# Patient Record
Sex: Female | Born: 1990 | Race: White | Hispanic: No | Marital: Married | Smoking: Never smoker
Health system: Southern US, Community
[De-identification: ages and names within clinical notes are randomized; demographics above are authoritative.]

## PROBLEM LIST (undated history)

## (undated) ENCOUNTER — Inpatient Hospital Stay (HOSPITAL_COMMUNITY): Payer: Self-pay

## (undated) DIAGNOSIS — Z8759 Personal history of other complications of pregnancy, childbirth and the puerperium: Secondary | ICD-10-CM

## (undated) DIAGNOSIS — D649 Anemia, unspecified: Secondary | ICD-10-CM

## (undated) HISTORY — PX: CHOLECYSTECTOMY: SHX55

---

## 2013-06-25 ENCOUNTER — Inpatient Hospital Stay (HOSPITAL_COMMUNITY)
Admission: AD | Admit: 2013-06-25 | Discharge: 2013-06-25 | Disposition: A | Discharge status: Home / Self Care | Payer: Medicaid Other | Source: Ambulatory Visit | Attending: Obstetrics and Gynecology | Admitting: Obstetrics and Gynecology

## 2013-06-25 ENCOUNTER — Encounter (HOSPITAL_COMMUNITY): Payer: Self-pay | Admitting: *Deleted

## 2013-06-25 DIAGNOSIS — O200 Threatened abortion: Secondary | ICD-10-CM | POA: Diagnosis present

## 2013-06-25 NOTE — MAU Note (Signed)
I should be 13wks by LMP but Thurs my u/s showed 7wks. Told me could be a miscarriage -no heart beat seen and sac not round. Was not given much detail of what to expect. Today having some cramping and brown/pink spotting

## 2013-06-25 NOTE — MAU Provider Note (Signed)
  History     CSN: [id number]  Arrival date and time: 06/25/13 1950   None     Chief Complaint  Patient presents with  . Abdominal Cramping  . Vaginal Bleeding   HPI This is a 23 y.o. female at 6932w4d by LMP who presents with vaginal bleeding. States not much on pad today but more like period bleeding now. Some mild cramping, tolerable without meds. Was seen in office with US that showed probable abnormal pregnancy. Has appt Monday for followup US.  RN Note:  I should be 13wks by LMP but Thurs my u/s showed 7wks. Told me could be a miscarriage -no heart beat seen and sac not round. Was not given much detail of what to expect. Today having some cramping and brown/pink spotting       OB History   Grav Para Term Preterm Abortions TAB SAB Ect Mult Living   1               History reviewed. No pertinent past medical history.  History reviewed. No pertinent past surgical history.  History reviewed. No pertinent family history.  History  Substance Use Topics  . Smoking status: Never Smoker   . Smokeless tobacco: Not on file  . Alcohol Use: No    Allergies: No Known Allergies  No prescriptions prior to admission    Review of Systems  Constitutional: Negative for fever, chills and malaise/fatigue.  Gastrointestinal: Positive for abdominal pain (mild cramps). Negative for nausea and vomiting.  Genitourinary:       Light period like bleeding  Neurological: Negative for dizziness and headaches.   Physical Exam   Blood pressure 124/73, pulse 95, temperature 98.4 F (36.9 C), resp. rate 20, height 5\' 4"  (1.626 m), weight 104.872 kg (231 lb 3.2 oz), last menstrual period 03/22/2013.  Physical Exam  Constitutional: She is oriented to person, place, and time. She appears well-developed and well-nourished. No distress.  HENT:  Head: Normocephalic.  Cardiovascular: Normal rate.   Respiratory: Effort normal.  GI: Soft. She exhibits no distension and no mass. There is no  tenderness. There is no rebound and no guarding.  Genitourinary: Uterus normal. Vaginal discharge (moderate blood in vault, no active flow, cervix long/closed, uterus nontender) found.  Musculoskeletal: Normal range of motion. She exhibits no edema.  Neurological: She is alert and oriented to person, place, and time.  Skin: Skin is warm and dry.  Psychiatric: She has a normal mood and affect.    MAU Course  Procedures  MDM DIscussed with Dr Henderson CloudHorvath. Offered Cytotec and bleeding precautions just in case she needs D&C.  Patient prefers to wait expectantly for Monday. States she knows it it probably miscarriage, but wants to hold out hope a bit longer.   Assessment and Plan  A:  SIUP at 4332w4d by dates, 7 wks by US      Threatened abortion  P:  Discussed issues with patient        Expectant management preferred for now        Bleeding precautions reviewed.         Followup in office as scheduled or PRN here.  [facility] CenterWILLIAMS,Rasheka Denard 06/25/2013, 8:45 PM

## 2013-06-25 NOTE — Discharge Instructions (Signed)
Threatened Miscarriage °Bleeding during the first 20 weeks of pregnancy is common. This is sometimes called a threatened miscarriage. This is a pregnancy that is threatening to end before the twentieth week of pregnancy. Often this bleeding stops with bed rest or decreased activities as suggested by your caregiver and the pregnancy continues without any more problems. You may be asked to not have sexual intercourse, have orgasms or use tampons until further notice. Sometimes a threatened miscarriage can progress to a complete or incomplete miscarriage. This may or may not require further treatment. Some miscarriages occur before a woman misses a menstrual period and knows she is pregnant. °Miscarriages occur in 15 to 20% of all pregnancies and usually occur during the first 13 weeks of the pregnancy. The exact cause of a miscarriage is usually never known. A miscarriage is natures way of ending a pregnancy that is abnormal or would not make it to term. There are some things that may put you at risk to have a miscarriage, such as: °· Hormone problems. °· Infection of the uterus or cervix. °· Chronic illness, diabetes for example, especially if it is not controlled. °· Abnormal shaped uterus. °· Fibroids in the uterus. °· Incompetent cervix (the cervix is too weak to hold the baby). °· Smoking. °· Drinking too much alcohol. It's best not to drink any alcohol when you are pregnant. °· Taking illegal drugs. °TREATMENT  °When a miscarriage becomes complete and all products of conception (all the tissue in the uterus) have been passed, often no treatment is needed. If you think you passed tissue, save it in a container and take it to your doctor for evaluation. If the miscarriage is incomplete (parts of the fetus or placenta remain in the uterus), further treatment may be needed. The most common reason for further treatment is continued bleeding (hemorrhage) because pregnancy tissue did not pass out of the uterus. This  often occurs if a miscarriage is incomplete. Tissue left behind may also become infected. Treatment usually is dilatation and curettage (the removal of the remaining products of pregnancy. This can be done by a simple sucking procedure (suction curettage) or a simple scraping of the inside of the uterus. This may be done in the hospital or in the caregiver's office. This is only done when your caregiver knows that there is no chance for the pregnancy to proceed to term. This is determined by physical examination, negative pregnancy test, falling pregnancy hormone count and/or, an ultrasound revealing a dead fetus. °Miscarriages are often a very emotional time for prospective mothers and fathers. This is not you or your partners fault. It did not occur because of an inadequacy in you or your partner. Nearly all miscarriages occur because the pregnancy has started off wrongly. At least half of these pregnancies have a chromosomal abnormality. It is almost always not inherited. Others may have developmental problems with the fetus or placenta. This does not always show up even when the products miscarried are studied under the microscope. The miscarriage is nearly always not your fault and it is not likely that you could have prevented it from happening. If you are having emotional and grieving problems, talk to your health care provider and even seek counseling, if necessary, before getting pregnant again. You can begin trying for another pregnancy as soon as your caregiver says it is OK. °HOME CARE INSTRUCTIONS  °· Your caregiver may order bed rest depending on how much bleeding and cramping you are having. You may be limited   to only getting up to go to the bathroom. You may be allowed to continue light activity. You may need to make arrangements for the care of your other children and for any other responsibilities. °· Keep track of the number of pads you use each day, how often you have to change pads and how  saturated (soaked) they are. Record this information. °· DO NOT USE TAMPONS. Do not douche, have sexual intercourse or orgasms until approved by your caregiver. °· You may receive a follow up appointment for re-evaluation of your pregnancy and a repeat blood test. Re-evaluation often occurs after 2 days and again in 4 to 6 weeks. It is very important that you follow-up in the recommended time period. °· If you are Rh negative and the father is Rh positive or you do not know the fathers' blood type, you may receive a shot (Rh immune globulin) to help prevent abnormal antibodies that can develop and affect the baby in any future pregnancies. °SEEK IMMEDIATE MEDICAL CARE IF: °· You have severe cramps in your stomach, back, or abdomen. °· You have a sudden onset of severe pain in the lower part of your abdomen. °· You develop chills. °· You run an unexplained temperature of 101° F (38.3° C) or higher. °· You pass large clots or tissue. Save any tissue for your caregiver to inspect. °· Your bleeding increases or you become light-headed, weak, or have fainting episodes. °· You have a gush of fluid from your vagina. °· You pass out. This could mean you have a tubal (ectopic) pregnancy. °Document Released: 04/28/2005 Document Revised: 07/21/2011 Document Reviewed: 12/13/2007 °ExitCare® Patient Information ©2014 [employer]. ° ° °Pelvic Rest °Pelvic rest is sometimes recommended for women when:  °· The placenta is partially or completely covering the opening of the cervix (placenta previa). °· There is bleeding between the uterine wall and the amniotic sac in the first trimester (subchorionic hemorrhage). °· The cervix begins to open without labor starting (incompetent cervix, cervical insufficiency). °· The labor is too early (preterm labor). °HOME CARE INSTRUCTIONS °· Do not have sexual intercourse, stimulation, or an orgasm. °· Do not use tampons, douche, or put anything in the vagina. °· Do not lift anything over 10  pounds (4.5 kg). °· Avoid strenuous activity or straining your pelvic muscles. °SEEK MEDICAL CARE IF:  °· You have any vaginal bleeding during pregnancy. Treat this as a potential emergency. °· You have cramping pain felt low in the stomach (stronger than menstrual cramps). °· You notice vaginal discharge (watery, mucus, or bloody). °· You have a low, dull backache. °· There are regular contractions or uterine tightening. °SEEK IMMEDIATE MEDICAL CARE IF: °You have vaginal bleeding and have placenta previa.  °Document Released: 08/23/2010 Document Revised: 07/21/2011 Document Reviewed: 08/23/2010 °ExitCare® Patient Information ©2014 [employer]. ° °

## 2013-11-02 LAB — OB RESULTS CONSOLE ABO/RH: RH TYPE: POSITIVE

## 2013-11-02 LAB — OB RESULTS CONSOLE RUBELLA ANTIBODY, IGM: Rubella: IMMUNE

## 2013-11-02 LAB — OB RESULTS CONSOLE RPR: RPR: NONREACTIVE

## 2013-11-02 LAB — OB RESULTS CONSOLE HEPATITIS B SURFACE ANTIGEN: HEP B S AG: NEGATIVE

## 2013-11-02 LAB — OB RESULTS CONSOLE ANTIBODY SCREEN: Antibody Screen: NEGATIVE

## 2013-11-02 LAB — OB RESULTS CONSOLE HIV ANTIBODY (ROUTINE TESTING): HIV: NONREACTIVE

## 2014-03-13 ENCOUNTER — Encounter (HOSPITAL_COMMUNITY): Payer: Self-pay | Admitting: *Deleted

## 2014-03-20 LAB — OB RESULTS CONSOLE GC/CHLAMYDIA
Chlamydia: NEGATIVE
Gonorrhea: NEGATIVE

## 2014-03-22 ENCOUNTER — Inpatient Hospital Stay (HOSPITAL_COMMUNITY)
Admission: AD | Admit: 2014-03-22 | Discharge: 2014-03-22 | Disposition: A | Discharge status: Home / Self Care | Payer: Medicaid Other | Source: Ambulatory Visit | Attending: Obstetrics and Gynecology | Admitting: Obstetrics and Gynecology

## 2014-03-22 ENCOUNTER — Encounter (HOSPITAL_COMMUNITY): Payer: Self-pay

## 2014-03-22 DIAGNOSIS — O2343 Unspecified infection of urinary tract in pregnancy, third trimester: Secondary | ICD-10-CM | POA: Insufficient documentation

## 2014-03-22 DIAGNOSIS — R109 Unspecified abdominal pain: Secondary | ICD-10-CM | POA: Diagnosis present

## 2014-03-22 DIAGNOSIS — O99891 Other specified diseases and conditions complicating pregnancy: Secondary | ICD-10-CM

## 2014-03-22 DIAGNOSIS — O9989 Other specified diseases and conditions complicating pregnancy, childbirth and the puerperium: Secondary | ICD-10-CM

## 2014-03-22 DIAGNOSIS — N390 Urinary tract infection, site not specified: Secondary | ICD-10-CM

## 2014-03-22 DIAGNOSIS — Z3A31 31 weeks gestation of pregnancy: Secondary | ICD-10-CM | POA: Diagnosis not present

## 2014-03-22 DIAGNOSIS — M549 Dorsalgia, unspecified: Secondary | ICD-10-CM

## 2014-03-22 LAB — FETAL FIBRONECTIN: Fetal Fibronectin: NEGATIVE

## 2014-03-22 LAB — URINE MICROSCOPIC-ADD ON

## 2014-03-22 LAB — URINALYSIS, ROUTINE W REFLEX MICROSCOPIC
Bilirubin Urine: NEGATIVE
Glucose, UA: NEGATIVE mg/dL
Hgb urine dipstick: NEGATIVE
Ketones, ur: NEGATIVE mg/dL
NITRITE: NEGATIVE
Protein, ur: NEGATIVE mg/dL
Specific Gravity, Urine: 1.02 (ref 1.005–1.030)
Urobilinogen, UA: 1 mg/dL (ref 0.0–1.0)
pH: 7 (ref 5.0–8.0)

## 2014-03-22 LAB — WET PREP, GENITAL
Clue Cells Wet Prep HPF POC: NONE SEEN
TRICH WET PREP: NONE SEEN
Yeast Wet Prep HPF POC: NONE SEEN

## 2014-03-22 MED ORDER — BETAMETHASONE SOD PHOS & ACET 6 (3-3) MG/ML IJ SUSP
12.0000 mg | Freq: Once | INTRAMUSCULAR | Status: AC
  Administered 2014-03-22: 12 mg via INTRAMUSCULAR
  Filled 2014-03-22: qty 2

## 2014-03-22 MED ORDER — NITROFURANTOIN MONOHYD MACRO 100 MG PO CAPS: 100.0000 mg | ORAL_CAPSULE | Freq: Two times a day (BID) | ORAL | Status: DC

## 2014-03-22 NOTE — Discharge Instructions (Signed)
Asymptomatic Bacteriuria Asymptomatic bacteriuria is the presence of a large number of bacteria in your urine without the usual symptoms of burning or frequent urination. The following conditions increase the risk of asymptomatic bacteriuria:  Diabetes mellitus.  Advanced age.  Pregnancy in the first trimester.  Kidney stones.  Kidney transplants.  Leaky kidney tube valve in young children (reflux). Treatment for this condition is not needed in most people and can lead to other problems such as too much yeast and growth of resistant bacteria. However, some people, such as pregnant women, do need treatment to prevent kidney infection. Asymptomatic bacteriuria in pregnancy is also associated with fetal growth restriction, premature labor, and newborn death. HOME CARE INSTRUCTIONS Monitor your condition for any changes. The following actions may help to relieve any discomfort you are feeling:  Drink enough water and fluids to keep your urine clear or pale yellow. Go to the bathroom more often to keep your bladder empty.  Keep the area around your vagina and rectum clean. Wipe yourself from front to back after urinating. SEEK IMMEDIATE MEDICAL CARE IF:  You develop signs of an infection such as:  Burning with urination.  Frequency of voiding.  Back pain.  Fever.  You have blood in the urine.  You develop a fever. MAKE SURE YOU:  Understand these instructions.  Will watch your condition.  Will get help right away if you are not doing well or get worse. Document Released: 04/28/2005 Document Revised: 09/12/2013 Document Reviewed: 10/18/2012 ExitCare Patient Information 2015 [employer]. This information is not intended to replace advice given to you by your health care provider. Make sure you discuss any questions you have with your health care provider.  

## 2014-03-22 NOTE — MAU Note (Signed)
Patient states she has been having pain all over the abdomen since about 1430. Has nausea, no vomiting. Denies leaking or bleeding but has a vaginal discharge. Reports good fetal movement but feeling movement lower in the abdomen.

## 2014-03-22 NOTE — MAU Provider Note (Signed)
History     CSN: [id number]  Arrival date and time: 03/22/14 1702   First Provider Initiated Contact with Patient 03/22/14 1752      Chief Complaint  Patient presents with  . Abdominal Pain   HPI   [name] is a 23 y.o. female G2P0010 at 419w6d who presents with contraction like pain and pain when she walks. At times the pain is in her abdomen and other times it is in her back. The pain started today around 1500 and has stayed about the same in intensity; the pain comes and goes. + fetal movements, denies leaking of fluid or vaginal bleeding.   No intercourse in the last 24 hour.   OB History    Gravida Para Term Preterm AB TAB SAB Ectopic Multiple Living   2    1  1          History reviewed. No pertinent past medical history.  History reviewed. No pertinent past surgical history.  Family History  Problem Relation Age of Onset  . Heart disease Mother   . Diabetes Father     History  Substance Use Topics  . Smoking status: Never Smoker   . Smokeless tobacco: Not on file  . Alcohol Use: No    Allergies: No Known Allergies  Prescriptions prior to admission  Medication Sig Dispense Refill Last Dose  . ferrous sulfate 325 (65 FE) MG tablet Take 325 mg by mouth daily with breakfast.   03/21/2014 at 2200  . fluconazole (DIFLUCAN) 150 MG tablet Take 150 mg by mouth once.   03/21/2014 at Unknown time  . Prenatal Vit-Fe Fumarate-FA (PRENATAL MULTIVITAMIN) TABS tablet Take 1 tablet by mouth daily at 12 noon.   Past Week at 2200  . ranitidine (ZANTAC) 150 MG capsule Take 150 mg by mouth daily as needed for heartburn.   03/21/2014 at 1200  . acetaminophen (TYLENOL) 325 MG tablet Take 650 mg by mouth every 6 (six) hours as needed for headache.   Completed Course at Unknown time   Results for orders placed or performed during the hospital encounter of 03/22/14 (from the past 48 hour(s))  Urinalysis, Routine w reflex microscopic     Status: Abnormal   Collection Time:  03/22/14  5:20 PM  Result Value Ref Range   Color, Urine YELLOW YELLOW   APPearance CLEAR CLEAR   Specific Gravity, Urine 1.020 1.005 - 1.030   pH 7.0 5.0 - 8.0   Glucose, UA NEGATIVE NEGATIVE mg/dL   Hgb urine dipstick NEGATIVE NEGATIVE   Bilirubin Urine NEGATIVE NEGATIVE   Ketones, ur NEGATIVE NEGATIVE mg/dL   Protein, ur NEGATIVE NEGATIVE mg/dL   Urobilinogen, UA 1.0 0.0 - 1.0 mg/dL   Nitrite NEGATIVE NEGATIVE   Leukocytes, UA SMALL (A) NEGATIVE  Urine microscopic-add on     Status: Abnormal   Collection Time: 03/22/14  5:20 PM  Result Value Ref Range   Squamous Epithelial / LPF FEW (A) RARE   WBC, UA 3-6 <3 WBC/hpf   RBC / HPF 0-2 <3 RBC/hpf   Bacteria, UA MANY (A) RARE   Urine-Other MUCOUS PRESENT   Fetal fibronectin     Status: None   Collection Time: 03/22/14  6:10 PM  Result Value Ref Range   Fetal Fibronectin NEGATIVE NEGATIVE  Wet prep, genital     Status: Abnormal   Collection Time: 03/22/14  6:10 PM  Result Value Ref Range   Yeast Wet Prep HPF POC NONE SEEN NONE SEEN  Trich, Wet Prep NONE SEEN NONE SEEN   Clue Cells Wet Prep HPF POC NONE SEEN NONE SEEN   WBC, Wet Prep HPF POC FEW (A) NONE SEEN    Comment: MODERATE BACTERIA SEEN    Review of Systems  Constitutional: Negative for fever and chills.  Gastrointestinal: Positive for nausea and abdominal pain. Negative for vomiting, diarrhea and constipation.  Genitourinary: Negative for dysuria, urgency and frequency.  Musculoskeletal: Positive for back pain (lower back pain ).   Physical Exam   Blood pressure 123/76, pulse 114, temperature 98.6 F (37 C), temperature source Oral, resp. rate 20, height 5\' 4"  (1.626 m), weight 111.313 kg (245 lb 6.4 oz), SpO2 99 %, unknown if currently breastfeeding.  Physical Exam  Constitutional: She is oriented to person, place, and time. She appears well-developed and well-nourished. No distress.  Cardiovascular: Normal rate and normal heart sounds.   Respiratory: Effort  normal.  GI: Soft. There is no tenderness. There is no CVA tenderness.  Genitourinary:  Speculum exam: Vagina - Small amount of creamy discharge in vaginal canal, no odor Cervix - No contact bleeding, moderate amount of mucoid discharge at cervical os  Bimanual exam: Cervix 1 cm, thick, posterior   wet prep done, fetal fibronectin  Chaperone present for exam.   Musculoskeletal: Normal range of motion.  Neurological: She is alert and oriented to person, place, and time.  Skin: Skin is warm. She is not diaphoretic.  Psychiatric: Her speech is normal and behavior is normal. Her mood appears anxious.    Fetal Tracing: Baseline: 125 bpm  Variability: Moderate  Accelerations: 15x15 Decelerations: none Toco: Q2-4 with irregular pattern, occasional UI .  Palpated uterine tone: relaxed   MAU Course  Procedures  None  MDM Fetal fibronectin negative UA shows many bacteria  Urine culture pending  Discussed fetal tracing, vital signs,cervical exam, and labs with Dr. Claiborne Billingsallahan .  Betamethasone today    Assessment and Plan   A: Preterm labor UTI   P: Discharge home in stable condition RX: Macrobid  Repeat betamethasone in 24 hours; patient to come to MAU Urine culture pending  Preterm labor precautions Return to MAU if symptoms worsen Pelvic rest   Iona HansenJennifer Irene Rasch, NP 03/22/2014 7:22 PM

## 2014-03-23 ENCOUNTER — Encounter (HOSPITAL_COMMUNITY): Payer: Self-pay | Admitting: *Deleted

## 2014-03-23 ENCOUNTER — Inpatient Hospital Stay (HOSPITAL_COMMUNITY)
Admission: AD | Admit: 2014-03-23 | Discharge: 2014-03-24 | Disposition: A | Discharge status: Home / Self Care | Payer: Medicaid Other | Source: Ambulatory Visit | Attending: Obstetrics and Gynecology | Admitting: Obstetrics and Gynecology

## 2014-03-23 DIAGNOSIS — R109 Unspecified abdominal pain: Secondary | ICD-10-CM | POA: Diagnosis not present

## 2014-03-23 DIAGNOSIS — Z3A32 32 weeks gestation of pregnancy: Secondary | ICD-10-CM | POA: Diagnosis not present

## 2014-03-23 DIAGNOSIS — O26899 Other specified pregnancy related conditions, unspecified trimester: Secondary | ICD-10-CM

## 2014-03-23 DIAGNOSIS — O26893 Other specified pregnancy related conditions, third trimester: Secondary | ICD-10-CM | POA: Insufficient documentation

## 2014-03-23 DIAGNOSIS — O9989 Other specified diseases and conditions complicating pregnancy, childbirth and the puerperium: Secondary | ICD-10-CM

## 2014-03-23 LAB — URINE CULTURE: SPECIAL REQUESTS: NORMAL

## 2014-03-23 LAB — URINALYSIS, ROUTINE W REFLEX MICROSCOPIC
GLUCOSE, UA: NEGATIVE mg/dL
HGB URINE DIPSTICK: NEGATIVE
Ketones, ur: >80 mg/dL — AB
Nitrite: NEGATIVE
PH: 6.5 (ref 5.0–8.0)
PROTEIN: 30 mg/dL — AB
Urobilinogen, UA: 1 mg/dL (ref 0.0–1.0)

## 2014-03-23 LAB — URINE MICROSCOPIC-ADD ON

## 2014-03-23 MED ORDER — BETAMETHASONE SOD PHOS & ACET 6 (3-3) MG/ML IJ SUSP
12.0000 mg | Freq: Once | INTRAMUSCULAR | Status: AC
  Administered 2014-03-23: 12 mg via INTRAMUSCULAR
  Filled 2014-03-23: qty 2

## 2014-03-23 MED ORDER — LACTATED RINGERS IV BOLUS (SEPSIS)
1000.0000 mL | Freq: Once | INTRAVENOUS | Status: AC
  Administered 2014-03-23: 1000 mL via INTRAVENOUS

## 2014-03-23 NOTE — MAU Provider Note (Signed)
History     CSN: [id number]  Arrival date and time: 03/23/14 1902   None      Patient is a 23 y.o. female presenting with cramps. The history is provided by the patient.  Abdominal Cramping The primary symptoms of the illness include abdominal pain. The current episode started 6 to 12 hours ago. The onset of the illness was gradual.  The patient states that she believes she is currently pregnant. The patient has not had a change in bowel habit. Additional symptoms associated with the illness include back pain.   Lori Li is a 23 y.o. G2P0010 @ 3255w1d gestation who presents to the MAU with lower abdominal cramping. She was evaluated here last night and had a positive FFN. She was given an injection of steroid and returned tonight for her second injection. While here she developed lower abdominal cramping.  OB History    Gravida Para Term Preterm AB TAB SAB Ectopic Multiple Living   2    1  1          History reviewed. No pertinent past medical history.  History reviewed. No pertinent past surgical history.  Family History  Problem Relation Age of Onset  . Heart disease Mother   . Diabetes Father     History  Substance Use Topics  . Smoking status: Never Smoker   . Smokeless tobacco: Not on file  . Alcohol Use: No    Allergies: No Known Allergies  No prescriptions prior to admission    Review of Systems  Gastrointestinal: Positive for abdominal pain.  Musculoskeletal: Positive for back pain.  All other systems negative  Blood pressure 120/64, pulse 107, temperature 98.1 F (36.7 C), temperature source Oral, resp. rate 18, SpO2 99 %, unknown if currently breastfeeding.  Physical Exam  Nursing note and vitals reviewed. Constitutional: She is oriented to person, place, and time. She appears well-developed and well-nourished. No distress.  HENT:  Head: Normocephalic and atraumatic.  Eyes: EOM are normal.  Neck: Neck supple.  Cardiovascular: Tachycardia  present.   Respiratory: Effort normal.  GI: Soft. There is no tenderness.  Genitourinary:  Dilation: 1 Station: Ballotable Exam by:: L.Weston,RN   Musculoskeletal: Normal range of motion.  Neurological: She is alert and oriented to person, place, and time.  Skin: Skin is warm and dry.  Psychiatric: She has a normal mood and affect. Her behavior is normal.    MAU Course  Procedures Results for orders placed or performed during the hospital encounter of 03/23/14 (from the past 24 hour(s))  Urinalysis, Routine w reflex microscopic     Status: Abnormal   Collection Time: 03/23/14  9:12 PM  Result Value Ref Range   Color, Urine YELLOW YELLOW   APPearance CLEAR CLEAR   Specific Gravity, Urine >1.030 (H) 1.005 - 1.030   pH 6.5 5.0 - 8.0   Glucose, UA NEGATIVE NEGATIVE mg/dL   Hgb urine dipstick NEGATIVE NEGATIVE   Bilirubin Urine SMALL (A) NEGATIVE   Ketones, ur >80 (A) NEGATIVE mg/dL   Protein, ur 30 (A) NEGATIVE mg/dL   Urobilinogen, UA 1.0 0.0 - 1.0 mg/dL   Nitrite NEGATIVE NEGATIVE   Leukocytes, UA TRACE (A) NEGATIVE  Urine microscopic-add on     Status: Abnormal   Collection Time: 03/23/14  9:12 PM  Result Value Ref Range   Squamous Epithelial / LPF MANY (A) RARE   WBC, UA 3-6 <3 WBC/hpf   Bacteria, UA MANY (A) RARE   Urine-Other MUCOUS PRESENT  MDM @ 2240 discussed with Dr. Henderson CloudHorvath Will administer LR Bolus, if patient's pain improves will plan to d/c home.  Reactive monitor tracing without contractions.   Assessment and Plan  23 y.o. G2P0010 @ 4177w0d with abdominal cramping in pregnancy. Feeling much better after IV hydration. Stable for discharge without pain or contractions. Discussed with the patient need for better hydration with PO fluids and to take her antibiotics that she was given Rx for last night. She voices understanding and agrees with plan. Instructions given regarding prevention of preterm labor. She will return as needed for problems.     NEESE,HOPE 03/24/2014, 1:52 AM

## 2014-03-23 NOTE — MAU Note (Signed)
Pt returns for repeat steroid injection, reports good fetal movement. States she feels a lot better today than yesterday.

## 2014-04-15 ENCOUNTER — Inpatient Hospital Stay (HOSPITAL_COMMUNITY)
Admission: AD | Admit: 2014-04-15 | Discharge: 2014-04-15 | Disposition: A | Discharge status: Home / Self Care | Payer: BC Managed Care – PPO | Source: Ambulatory Visit | Attending: Obstetrics and Gynecology | Admitting: Obstetrics and Gynecology

## 2014-04-15 ENCOUNTER — Encounter (HOSPITAL_COMMUNITY): Payer: Self-pay

## 2014-04-15 DIAGNOSIS — Z3A35 35 weeks gestation of pregnancy: Secondary | ICD-10-CM | POA: Diagnosis not present

## 2014-04-15 DIAGNOSIS — O98813 Other maternal infectious and parasitic diseases complicating pregnancy, third trimester: Secondary | ICD-10-CM | POA: Insufficient documentation

## 2014-04-15 DIAGNOSIS — N949 Unspecified condition associated with female genital organs and menstrual cycle: Secondary | ICD-10-CM | POA: Diagnosis present

## 2014-04-15 DIAGNOSIS — B373 Candidiasis of vulva and vagina: Secondary | ICD-10-CM | POA: Diagnosis not present

## 2014-04-15 DIAGNOSIS — B3731 Acute candidiasis of vulva and vagina: Secondary | ICD-10-CM

## 2014-04-15 DIAGNOSIS — O4703 False labor before 37 completed weeks of gestation, third trimester: Secondary | ICD-10-CM

## 2014-04-15 DIAGNOSIS — O2343 Unspecified infection of urinary tract in pregnancy, third trimester: Secondary | ICD-10-CM | POA: Insufficient documentation

## 2014-04-15 LAB — URINALYSIS, ROUTINE W REFLEX MICROSCOPIC
BILIRUBIN URINE: NEGATIVE
Glucose, UA: NEGATIVE mg/dL
Hgb urine dipstick: NEGATIVE
Ketones, ur: NEGATIVE mg/dL
Nitrite: NEGATIVE
PH: 6.5 (ref 5.0–8.0)
PROTEIN: NEGATIVE mg/dL
Specific Gravity, Urine: 1.015 (ref 1.005–1.030)
Urobilinogen, UA: 1 mg/dL (ref 0.0–1.0)

## 2014-04-15 LAB — WET PREP, GENITAL
Clue Cells Wet Prep HPF POC: NONE SEEN
Trich, Wet Prep: NONE SEEN
Yeast Wet Prep HPF POC: NONE SEEN

## 2014-04-15 LAB — URINE MICROSCOPIC-ADD ON

## 2014-04-15 MED ORDER — NITROFURANTOIN MONOHYD MACRO 100 MG PO CAPS: 100.0000 mg | ORAL_CAPSULE | Freq: Two times a day (BID) | ORAL | Status: DC

## 2014-04-15 MED ORDER — TERCONAZOLE 0.4 % VA CREA: 1.0000 | TOPICAL_CREAM | Freq: Every day | VAGINAL | Status: DC

## 2014-04-15 NOTE — MAU Note (Signed)
Pt states here for pressure in vaginal/low pelvic area. Does have increased frequency and urgency with voiding, though voids small amounts.

## 2014-04-15 NOTE — Discharge Instructions (Signed)
Pregnancy and Urinary Tract Infection °A urinary tract infection (UTI) is a bacterial infection of the urinary tract. Infection of the urinary tract can include the ureters, kidneys (pyelonephritis), bladder (cystitis), and urethra (urethritis). All pregnant women should be screened for bacteria in the urinary tract. Identifying and treating a UTI will decrease the risk of preterm labor and developing more serious infections in both the mother and baby. °CAUSES °Bacteria germs cause almost all UTIs.  °RISK FACTORS °Many factors can increase your chances of getting a UTI during pregnancy. These include: °· Having a short urethra. °· Poor toilet and hygiene habits. °· Sexual intercourse. °· Blockage of urine along the urinary tract. °· Problems with the pelvic muscles or nerves. °· Diabetes. °· Obesity. °· Bladder problems after having several children. °· Previous history of UTI. °SIGNS AND SYMPTOMS  °· Pain, burning, or a stinging feeling when urinating. °· Suddenly feeling the need to urinate right away (urgency). °· Loss of bladder control (urinary incontinence). °· Frequent urination, more than is common with pregnancy. °· Lower abdominal or back discomfort. °· Cloudy urine. °· Blood in the urine (hematuria). °· Fever.  °When the kidneys are infected, the symptoms may be: °· Back pain. °· Flank pain on the right side more so than the left. °· Fever. °· Chills. °· Nausea. °· Vomiting. °DIAGNOSIS  °A urinary tract infection is usually diagnosed through urine tests. Additional tests and procedures are sometimes done. These may include: °· Ultrasound exam of the kidneys, ureters, bladder, and urethra. °· Looking in the bladder with a lighted tube (cystoscopy). °TREATMENT °Typically, UTIs can be treated with antibiotic medicines.  °HOME CARE INSTRUCTIONS  °· Only take over-the-counter or prescription medicines as directed by your health care provider. If you were prescribed antibiotics, take them as directed. Finish  them even if you start to feel better. °· Drink enough fluids to keep your urine clear or pale yellow. °· Do not have sexual intercourse until the infection is gone and your health care provider says it is okay. °· Make sure you are tested for UTIs throughout your pregnancy. These infections often come back.  °Preventing a UTI in the Future °· Practice good toilet habits. Always wipe from front to back. Use the tissue only once. °· Do not hold your urine. Empty your bladder as soon as possible when the urge comes. °· Do not douche or use deodorant sprays. °· Wash with soap and warm water around the genital area and the anus. °· Empty your bladder before and after sexual intercourse. °· Wear underwear with a cotton crotch. °· Avoid caffeine and carbonated drinks. They can irritate the bladder. °· Drink cranberry juice or take cranberry pills. This may decrease the risk of getting a UTI. °· Do not drink alcohol. °· Keep all your appointments and tests as scheduled.  °SEEK MEDICAL CARE IF:  °· Your symptoms get worse. °· You are still having fevers 2 or more days after treatment begins. °· You have a rash. °· You feel that you are having problems with medicines prescribed. °· You have abnormal vaginal discharge. °SEEK IMMEDIATE MEDICAL CARE IF:  °· You have back or flank pain. °· You have chills. °· You have blood in your urine. °· You have nausea and vomiting. °· You have contractions of your uterus. °· You have a gush of fluid from the vagina. °MAKE SURE YOU: °· Understand these instructions.   °· Will watch your condition.   °· Will get help right away if you are not doing   well or get worse.  Document Released: 08/23/2010 Document Revised: 02/16/2013 Document Reviewed: 11/25/2012 [facility] Patient Information 2015 [place], [place]. This information is not intended to replace advice given to you by your health care provider. Make sure you discuss any questions you have with your health care provider.  Candidal  Vulvovaginitis Candidal vulvovaginitis is an infection of the vagina and vulva. The vulva is the skin around the opening of the vagina. This may cause itching and discomfort in and around the vagina.  HOME CARE  Only take medicine as told by your doctor.  Do not have sex (intercourse) until the infection is healed or as told by your doctor.  Practice safe sex.  Tell your sex partner about your infection.  Do not douche or use tampons.  Wear cotton underwear. Do not wear tight pants or panty hose.  Eat yogurt. This may help treat and prevent yeast infections. GET HELP RIGHT AWAY IF:   You have a fever.  Your problems get worse during treatment or do not get better in 3 days.  You have discomfort, irritation, or itching in your vagina or vulva area.  You have pain after sex.  You start to get belly (abdominal) pain. MAKE SURE YOU:  Understand these instructions.  Will watch your condition.  Will get help right away if you are not doing well or get worse. Document Released: 07/25/2008 Document Revised: 05/03/2013 Document Reviewed: 07/25/2008 [facility] Patient Information 2015 [place], [place]. This information is not intended to replace advice given to you by your health care provider. Make sure you discuss any questions you have with your health care provider.   Braxton Hicks Contractions Contractions of the uterus can occur throughout pregnancy. Contractions are not always a sign that you are in labor.  WHAT ARE BRAXTON HICKS CONTRACTIONS?  Contractions that occur before labor are called Braxton Hicks contractions, or false labor. Toward the end of pregnancy (32-34 weeks), these contractions can develop more often and may become more forceful. This is not true labor because these contractions do not result in opening (dilatation) and thinning of the cervix. They are sometimes difficult to tell apart from true labor because these contractions can be forceful and people have  different pain tolerances. You should not feel embarrassed if you go to the hospital with false labor. Sometimes, the only way to tell if you are in true labor is for your health care provider to look for changes in the cervix. If there are no prenatal problems or other health problems associated with the pregnancy, it is completely safe to be sent home with false labor and await the onset of true labor. HOW CAN YOU TELL THE DIFFERENCE BETWEEN TRUE AND FALSE LABOR? False Labor  The contractions of false labor are usually shorter and not as hard as those of true labor.   The contractions are usually irregular.   The contractions are often felt in the front of the lower abdomen and in the groin.   The contractions may go away when you walk around or change positions while lying down.   The contractions get weaker and are shorter lasting as time goes on.   The contractions do not usually become progressively stronger, regular, and closer together as with true labor.  True Labor  Contractions in true labor last 30-70 seconds, become very regular, usually become more intense, and increase in frequency.   The contractions do not go away with walking.   The discomfort is usually felt in  the top of the uterus and spreads to the lower abdomen and low back.   True labor can be determined by your health care provider with an exam. This will show that the cervix is dilating and getting thinner.  WHAT TO REMEMBER  Keep up with your usual exercises and follow other instructions given by your health care provider.   Take medicines as directed by your health care provider.   Keep your regular prenatal appointments.   Eat and drink lightly if you think you are going into labor.   If Braxton Hicks contractions are making you uncomfortable:   Change your position from lying down or resting to walking, or from walking to resting.   Sit and rest in a tub of warm water.   Drink  2-3 glasses of water. Dehydration may cause these contractions.   Do slow and deep breathing several times an hour.  WHEN SHOULD I SEEK IMMEDIATE MEDICAL CARE? Seek immediate medical care if:  Your contractions become stronger, more regular, and closer together.   You have fluid leaking or gushing from your vagina.   You have a fever.   You pass blood-tinged mucus.   You have vaginal bleeding.   You have continuous abdominal pain.   You have low back pain that you never had before.   You feel your baby's head pushing down and causing pelvic pressure.   Your baby is not moving as much as it used to.  Document Released: 04/28/2005 Document Revised: 05/03/2013 Document Reviewed: 02/07/2013 [facility] Patient Information 2015 [place]ExitCare, [place]LL[place]. This information is not intended to replace advice given to you by your health care provider. Make sure you discuss any questions you have with your health care provider.

## 2014-04-15 NOTE — MAU Provider Note (Signed)
Chief Complaint:  Vaginal pressure   First Provider Initiated Contact with Patient 04/15/14 1337     HPI: Lori ChardKristin Li is a 23 y.o. G2P0010 at [id number] who presents to maternity admissions reporting: 1. constant low abdominal and pelvic pressure since yesterday 2. urinary frequency and urgency times a few days 3. Thick yellow vaginal discharge times a few days  Unsure if she's having contractions. Denies fever, chills, hematuria, flank pain, leaking of fluid or vaginal bleeding. Good fetal movement.   Pregnancy Course: Uncomplicated  Past Medical History: History reviewed. No pertinent past medical history.  Past obstetric history: OB History  Gravida Para Term Preterm AB SAB TAB Ectopic Multiple Living  2    1 1         # Outcome Date GA Lbr Len/2nd Weight Sex Delivery Anes PTL Lv  2 Current           1 SAB               Past Surgical History: History reviewed. No pertinent past surgical history.   Family History: Family History  Problem Relation Age of Onset  . Heart disease Mother   . Diabetes Father     Social History: History  Substance Use Topics  . Smoking status: Never Smoker   . Smokeless tobacco: Not on file  . Alcohol Use: No    Allergies: No Known Allergies  Meds:  Prescriptions prior to admission  Medication Sig Dispense Refill Last Dose  . acetaminophen (TYLENOL) 325 MG tablet Take 650 mg by mouth every 6 (six) hours as needed for mild pain or headache.    Past Week at Unknown time  . ferrous sulfate 325 (65 FE) MG tablet Take 325 mg by mouth at bedtime.    04/14/2014 at Unknown time  . Prenatal Vit-Min-FA-Fish Oil ([facility] PRENATAL GUMMY PO) Take 2 each by mouth daily.   04/14/2014 at Unknown time  . ranitidine (ZANTAC) 150 MG capsule Take 150 mg by mouth daily as needed for heartburn.   04/14/2014 at Unknown time  . fluconazole (DIFLUCAN) 150 MG tablet Take 150 mg by mouth once.   03/22/2014 at Unknown time  . Prenatal Vit-Fe Fumarate-FA (PRENATAL  MULTIVITAMIN) TABS tablet Take 1 tablet by mouth at bedtime.    03/22/2014 at Unknown time  . [DISCONTINUED] nitrofurantoin, macrocrystal-monohydrate, (MACROBID) 100 MG capsule Take 1 capsule (100 mg total) by mouth 2 (two) times daily. (Patient not taking: Reported on 04/15/2014) 10 capsule 0 03/23/2014 at Unknown time    ROS: Pertinent positive findings in history of present illness.. Negative for fever, chills, vaginal bleeding, leaking fluid, nausea, vomiting, diarrhea, palpation, flank pain, hematuria.  Physical Exam  Blood pressure 127/80, pulse 94, temperature 98 F (36.7 C), temperature source Oral, resp. rate 18, unknown if currently breastfeeding. GENERAL: Well-developed, well-nourished female in no acute distress.  HEENT: normocephalic HEART: normal rate RESP: normal effort ABDOMEN: Soft, non-tender, gravid appropriate for gestational age. No CVAT. EXTREMITIES: Nontender, no edema NEURO: alert and oriented SPECULUM EXAM: NEFG, large amount of thick, yellowish-white, curdlike, odorless, discharge mixed w/ smal amoutn of thin, white discharge, no blood, cervix clean Dilation: 1 Effacement (%): Thick Cervical Position: Posterior Station: Ballotable Presentation: Undeterminable Exam by:: Dorathy KinsmanVirginia Smith, CNM  FHT:  Baseline 140 , moderate variability, 15x15accelerations present, no decelerations Contractions: UI, rare contractions   Labs: Results for orders placed or performed during the hospital encounter of 04/15/14 (from the past 24 hour(s))  Urinalysis, Routine w reflex microscopic  Status: Abnormal   Collection Time: 04/15/14 12:26 PM  Result Value Ref Range   Color, Urine YELLOW YELLOW   APPearance CLEAR CLEAR   Specific Gravity, Urine 1.015 1.005 - 1.030   pH 6.5 5.0 - 8.0   Glucose, UA NEGATIVE NEGATIVE mg/dL   Hgb urine dipstick NEGATIVE NEGATIVE   Bilirubin Urine NEGATIVE NEGATIVE   Ketones, ur NEGATIVE NEGATIVE mg/dL   Protein, ur NEGATIVE NEGATIVE mg/dL    Urobilinogen, UA 1.0 0.0 - 1.0 mg/dL   Nitrite NEGATIVE NEGATIVE   Leukocytes, UA MODERATE (A) NEGATIVE  Urine microscopic-add on     Status: Abnormal   Collection Time: 04/15/14 12:26 PM  Result Value Ref Range   Squamous Epithelial / LPF FEW (A) RARE   WBC, UA 11-20 <3 WBC/hpf   RBC / HPF 3-6 <3 RBC/hpf   Bacteria, UA MANY (A) RARE  Wet prep, genital     Status: Abnormal   Collection Time: 04/15/14  1:55 PM  Result Value Ref Range   Yeast Wet Prep HPF POC NONE SEEN NONE SEEN   Trich, Wet Prep NONE SEEN NONE SEEN   Clue Cells Wet Prep HPF POC NONE SEEN NONE SEEN   WBC, Wet Prep HPF POC MODERATE (A) NONE SEEN  Fern neg.  Imaging:  No results found.   MAU Course:   Assessment: 1. UTI in pregnancy, third trimester   2. Vaginal yeast infection   3. Preterm contractions, third trimester    Plan: Discharge home in stable condition per consult with Dr. Henderson CloudHorvath. Preterm labor precautions and fetal kick counts     Follow-up Information    [facility] Sanjuana MaeWALDA STACIA, MD.   Specialty:  Obstetrics and Gynecology   Why:  As scheduled or As needed if symptoms worsen   Contact information:   [address] [place] [id number] [phone]       [facility].   Why:  As needed in emergencies   Contact information:   [address] [id number] mc [place] [id number] [phone]    Urine culture pending   Medication List    STOP taking these medications        fluconazole 150 MG tablet  Commonly known as:  DIFLUCAN     prenatal multivitamin Tabs tablet      TAKE these medications        acetaminophen 325 MG tablet  Commonly known as:  TYLENOL  Take 650 mg by mouth every 6 (six) hours as needed for mild pain or headache.     [facility] PRENATAL GUMMY PO  Take 2 each by mouth daily.     ferrous sulfate 325 (65 FE) MG tablet  Take 325 mg by mouth at bedtime.      nitrofurantoin (macrocrystal-monohydrate) 100 MG capsule  Commonly known as:  MACROBID  Take 1 capsule (100 mg total) by mouth 2 (two) times daily.     ranitidine 150 MG capsule  Commonly known as:  ZANTAC  Take 150 mg by mouth daily as needed for heartburn.     terconazole 0.4 % vaginal cream  Commonly known as:  TERAZOL 7  Place 1 applicator vaginally at bedtime. x 7 nights        [place]ia Smith, [place] IslandCNM 04/15/2014 4:09 PM

## 2014-04-16 LAB — CULTURE, OB URINE
Colony Count: 50000
Special Requests: NORMAL

## 2014-04-21 ENCOUNTER — Other Ambulatory Visit: Payer: Self-pay | Admitting: Obstetrics and Gynecology

## 2014-04-25 LAB — OB RESULTS CONSOLE GBS: GBS: NEGATIVE

## 2014-04-28 ENCOUNTER — Encounter (HOSPITAL_COMMUNITY): Payer: Self-pay | Admitting: *Deleted

## 2014-04-28 ENCOUNTER — Inpatient Hospital Stay (HOSPITAL_COMMUNITY)
Admission: AD | Admit: 2014-04-28 | Discharge: 2014-04-28 | Disposition: A | Discharge status: Home / Self Care | Payer: BC Managed Care – PPO | Source: Ambulatory Visit | Attending: Obstetrics & Gynecology | Admitting: Obstetrics & Gynecology

## 2014-04-28 DIAGNOSIS — M545 Low back pain: Secondary | ICD-10-CM | POA: Diagnosis not present

## 2014-04-28 DIAGNOSIS — O9989 Other specified diseases and conditions complicating pregnancy, childbirth and the puerperium: Secondary | ICD-10-CM | POA: Diagnosis not present

## 2014-04-28 DIAGNOSIS — Z3A37 37 weeks gestation of pregnancy: Secondary | ICD-10-CM | POA: Insufficient documentation

## 2014-04-28 DIAGNOSIS — R109 Unspecified abdominal pain: Secondary | ICD-10-CM | POA: Diagnosis present

## 2014-04-28 HISTORY — DX: Anemia, unspecified: D64.9

## 2014-04-28 LAB — URINALYSIS, ROUTINE W REFLEX MICROSCOPIC
Bilirubin Urine: NEGATIVE
Glucose, UA: NEGATIVE mg/dL
Hgb urine dipstick: NEGATIVE
Ketones, ur: NEGATIVE mg/dL
NITRITE: NEGATIVE
PH: 7 (ref 5.0–8.0)
Protein, ur: NEGATIVE mg/dL
Specific Gravity, Urine: 1.02 (ref 1.005–1.030)
Urobilinogen, UA: 0.2 mg/dL (ref 0.0–1.0)

## 2014-04-28 LAB — URINE MICROSCOPIC-ADD ON

## 2014-04-28 NOTE — Discharge Instructions (Signed)

## 2014-04-28 NOTE — MAU Provider Note (Signed)
History     CSN: [id number]  Arrival date and time: 04/28/14 1400   First Provider Initiated Contact with Patient 04/28/14 1510      Chief Complaint  Patient presents with  . Back Pain  . Abdominal Cramping  . Rupture of Membranes   HPI Lori Li 23 y.o. G2P0010 @[redacted]w[redacted]d  presents to MAU complaining of cramping but also indicates to the nurse she is having a tiny bit of wetness in her underwear.  She hasn't noticed a gush and her pants are staying dry.  Baby is moving well.  She denies vaginal bleeding, dyruria.  She just finished macrobid for a UTI 1 week ago.  She denies fever, vomiting, weakness, chills, headache.  She has nausea and diarrhea twice.  Her cramping is in the lower belly, hips and back.  She cannot time contractions.   It has been as severe as 5/10 but is now 1-2/10.   OB History    Gravida Para Term Preterm AB TAB SAB Ectopic Multiple Living   2    1  1    0      Past Medical History  Diagnosis Date  . Anemia     Past Surgical History  Procedure Laterality Date  . No past surgeries      Family History  Problem Relation Age of Onset  . Heart disease Mother   . Diabetes Father     History  Substance Use Topics  . Smoking status: Never Smoker   . Smokeless tobacco: Not on file  . Alcohol Use: No    Allergies: No Known Allergies  Prescriptions prior to admission  Medication Sig Dispense Refill Last Dose  . ferrous sulfate 325 (65 FE) MG tablet Take 325 mg by mouth at bedtime.    04/27/2014 at Unknown time  . Prenatal Vit-Min-FA-Fish Oil ([facility] PRENATAL GUMMY PO) Take 2 each by mouth daily.   04/27/2014 at Unknown time  . ranitidine (ZANTAC) 150 MG capsule Take 150 mg by mouth daily as needed for heartburn.   04/27/2014 at Unknown time  . nitrofurantoin, macrocrystal-monohydrate, (MACROBID) 100 MG capsule Take 1 capsule (100 mg total) by mouth 2 (two) times daily. (Patient not taking: Reported on 04/28/2014) 14 capsule 0   . terconazole (TERAZOL 7)  0.4 % vaginal cream Place 1 applicator vaginally at bedtime. x 7 nights (Patient not taking: Reported on 04/28/2014) 45 g 0     ROS Physical Exam   Blood pressure 126/79, pulse 97, temperature 98.1 F (36.7 C), temperature source Oral, resp. rate 18, SpO2 97 %, unknown if currently breastfeeding.  Physical Exam  Constitutional: She is oriented to person, place, and time. She appears well-developed and well-nourished.  HENT:  Head: Normocephalic and atraumatic.  Eyes: EOM are normal.  Neck: Normal range of motion.  Cardiovascular: Normal rate and regular rhythm.   Respiratory: Breath sounds normal. No respiratory distress.  GI: Soft. Bowel sounds are normal. She exhibits no distension. There is no tenderness.  Genitourinary:   large amount of mucus and white discharge.  Upon removal with fox swab, pt was asked to valsalva.  NO pooling noted  Musculoskeletal: Normal range of motion.  Neurological: She is alert and oriented to person, place, and time.  Skin: Skin is warm and dry.  Psychiatric: She has a normal mood and affect.    Results for orders placed or performed during the hospital encounter of 04/28/14 (from the past 24 hour(s))  Urinalysis, Routine w reflex microscopic  Status: Abnormal   Collection Time: 04/28/14  2:15 PM  Result Value Ref Range   Color, Urine YELLOW YELLOW   APPearance CLOUDY (A) CLEAR   Specific Gravity, Urine 1.020 1.005 - 1.030   pH 7.0 5.0 - 8.0   Glucose, UA NEGATIVE NEGATIVE mg/dL   Hgb urine dipstick NEGATIVE NEGATIVE   Bilirubin Urine NEGATIVE NEGATIVE   Ketones, ur NEGATIVE NEGATIVE mg/dL   Protein, ur NEGATIVE NEGATIVE mg/dL   Urobilinogen, UA 0.2 0.0 - 1.0 mg/dL   Nitrite NEGATIVE NEGATIVE   Leukocytes, UA LARGE (A) NEGATIVE  Urine microscopic-add on     Status: Abnormal   Collection Time: 04/28/14  2:15 PM  Result Value Ref Range   Squamous Epithelial / LPF MANY (A) RARE   WBC, UA 7-10 <3 WBC/hpf   Bacteria, UA MANY (A) RARE    Urine-Other MUCOUS PRESENT    A/P: Exam for ROM RN will report to attending MD    MAU Course  Procedures  MDM Ferning negative.  NO pooling on exam.  No regular contractions.  Assessment and Plan  A/P: Exam for ROM RN will report to attending MD   Glyn Adeeague Clark, Scot JunKaren E 04/28/2014, 3:11 PM

## 2014-04-28 NOTE — MAU Note (Signed)
Pt states she has been having abd cramping and lower back pain since 0530 this morning.  Not sure if it's contractions.  Unable to time.  Denies vaginal bleeding.  States she has had wet panties x 1 and watery.  Good fetal movement.

## 2014-04-29 LAB — CULTURE, OB URINE

## 2014-05-10 ENCOUNTER — Inpatient Hospital Stay (HOSPITAL_COMMUNITY)
Admission: AD | Admit: 2014-05-10 | Discharge: 2014-05-10 | Disposition: A | Discharge status: Home / Self Care | Payer: BC Managed Care – PPO | Source: Ambulatory Visit | Attending: Obstetrics and Gynecology | Admitting: Obstetrics and Gynecology

## 2014-05-10 ENCOUNTER — Encounter (HOSPITAL_COMMUNITY): Payer: Self-pay | Admitting: *Deleted

## 2014-05-10 DIAGNOSIS — O133 Gestational [pregnancy-induced] hypertension without significant proteinuria, third trimester: Secondary | ICD-10-CM

## 2014-05-10 DIAGNOSIS — Z3A38 38 weeks gestation of pregnancy: Secondary | ICD-10-CM | POA: Diagnosis not present

## 2014-05-10 DIAGNOSIS — O471 False labor at or after 37 completed weeks of gestation: Secondary | ICD-10-CM

## 2014-05-10 DIAGNOSIS — O479 False labor, unspecified: Secondary | ICD-10-CM

## 2014-05-10 DIAGNOSIS — R030 Elevated blood-pressure reading, without diagnosis of hypertension: Secondary | ICD-10-CM | POA: Diagnosis present

## 2014-05-10 LAB — URINALYSIS, ROUTINE W REFLEX MICROSCOPIC
BILIRUBIN URINE: NEGATIVE
GLUCOSE, UA: NEGATIVE mg/dL
HGB URINE DIPSTICK: NEGATIVE
KETONES UR: 40 mg/dL — AB
Nitrite: NEGATIVE
PROTEIN: NEGATIVE mg/dL
Specific Gravity, Urine: 1.025 (ref 1.005–1.030)
Urobilinogen, UA: 1 mg/dL (ref 0.0–1.0)
pH: 7 (ref 5.0–8.0)

## 2014-05-10 LAB — CBC
HEMATOCRIT: 35 % — AB (ref 36.0–46.0)
Hemoglobin: 11.9 g/dL — ABNORMAL LOW (ref 12.0–15.0)
MCH: 28.3 pg (ref 26.0–34.0)
MCHC: 34 g/dL (ref 30.0–36.0)
MCV: 83.3 fL (ref 78.0–100.0)
Platelets: 198 10*3/uL (ref 150–400)
RBC: 4.2 MIL/uL (ref 3.87–5.11)
RDW: 15.1 % (ref 11.5–15.5)
WBC: 10.4 10*3/uL (ref 4.0–10.5)

## 2014-05-10 LAB — URINE MICROSCOPIC-ADD ON

## 2014-05-10 LAB — COMPREHENSIVE METABOLIC PANEL
ALBUMIN: 3.1 g/dL — AB (ref 3.5–5.2)
ALT: 9 U/L (ref 0–35)
AST: 17 U/L (ref 0–37)
Alkaline Phosphatase: 144 U/L — ABNORMAL HIGH (ref 39–117)
Anion gap: 9 (ref 5–15)
BUN: 7 mg/dL (ref 6–23)
CO2: 18 mmol/L — ABNORMAL LOW (ref 19–32)
Calcium: 9.2 mg/dL (ref 8.4–10.5)
Chloride: 108 mEq/L (ref 96–112)
Glucose, Bld: 76 mg/dL (ref 70–99)
Potassium: 3.6 mmol/L (ref 3.5–5.1)
Sodium: 135 mmol/L (ref 135–145)
Total Bilirubin: 0.5 mg/dL (ref 0.3–1.2)
Total Protein: 6.6 g/dL (ref 6.0–8.3)

## 2014-05-10 LAB — PROTEIN / CREATININE RATIO, URINE
CREATININE, URINE: 188 mg/dL
PROTEIN CREATININE RATIO: 0.11 (ref 0.00–0.15)
Total Protein, Urine: 20 mg/dL

## 2014-05-10 NOTE — MAU Note (Signed)
Urine in lab 

## 2014-05-10 NOTE — MAU Provider Note (Signed)
Chief Complaint:  Hypertension  First Provider Initiated Contact with Patient 05/10/14 1659     HPI: Georgie ChardKristin Wawrzyniak is a 23 y.o. G2P0010 at 2417w6d who was sent to maternity admissions for Pre-eclampsia eval. BP elevated in office,.    Denies contractions, bleeding, leaking, headache, e[pigastric pain or visual changes.. Reports good fetal movement. Cervix 4 cm at last exam per pt.   Pregnancy Course: Uncomplicated  Past Medical History: Past Medical History  Diagnosis Date  . Anemia     Past obstetric history: OB History  Gravida Para Term Preterm AB SAB TAB Ectopic Multiple Living  2    1 1     0    # Outcome Date GA Lbr Len/2nd Weight Sex Delivery Anes PTL Lv  2 Current           1 SAB               Past Surgical History: Past Surgical History  Procedure Laterality Date  . No past surgeries       Family History: Family History  Problem Relation Age of Onset  . Heart disease Mother   . Diabetes Father     Social History: History  Substance Use Topics  . Smoking status: Never Smoker   . Smokeless tobacco: Not on file  . Alcohol Use: No    Allergies: No Known Allergies  Meds:  Prescriptions prior to admission  Medication Sig Dispense Refill Last Dose  . ferrous sulfate 325 (65 FE) MG tablet Take 325 mg by mouth at bedtime.    05/09/2014 at 2200  . Prenatal Vit-Min-FA-Fish Oil ([facility] PRENATAL GUMMY PO) Take 2 each by mouth daily.   Past Week at Unknown time  . ranitidine (ZANTAC) 150 MG capsule Take 150 mg by mouth every evening.    05/09/2014 at 2200  . nitrofurantoin, macrocrystal-monohydrate, (MACROBID) 100 MG capsule Take 1 capsule (100 mg total) by mouth 2 (two) times daily. (Patient not taking: Reported on 04/28/2014) 14 capsule 0 Not Taking at Unknown time  . terconazole (TERAZOL 7) 0.4 % vaginal cream Place 1 applicator vaginally at bedtime. x 7 nights (Patient not taking: Reported on 04/28/2014) 45 g 0 Not Taking at Unknown time    ROS: Pertinent findings  in history of present illness.  Physical Exam  Blood pressure 118/70, pulse 93, temperature 98.9 F (37.2 C), temperature source Oral, resp. rate 18, height 5\' 5"  (1.651 m), weight 113.944 kg (251 lb 3.2 oz), SpO2 99 %, unknown if currently breastfeeding.  Patient Vitals for the past 24 hrs:  BP Temp Temp src Pulse Resp SpO2 Height Weight  05/10/14 1905 118/70 mmHg - - 93 18 - - -  05/10/14 1903 118/70 mmHg - - 93 - - - -  05/10/14 1751 - - - 98 - 99 % - -  05/10/14 1746 - - - 95 - 99 % - -  05/10/14 1741 - - - 91 - 99 % - -  05/10/14 1736 - - - 88 - 99 % - -  05/10/14 1731 - - - 97 - 98 % - -  05/10/14 1726 - - - 87 - 98 % - -  05/10/14 1703 126/77 mmHg - - 94 - - - -  05/10/14 1653 111/68 mmHg - - 115 - - - -  05/10/14 1633 117/74 mmHg - - 99 - - - -  05/10/14 1623 110/71 mmHg - - 92 - - - -  05/10/14 1613 114/71 mmHg - -  96 - - - -  05/10/14 1603 118/70 mmHg - - 96 - - - -  05/10/14 1553 121/73 mmHg - - 97 - - - -  05/10/14 1549 127/74 mmHg - - 90 - - - -  05/10/14 1532 126/80 mmHg 98.9 F (37.2 C) Oral 99 16 100 % 5\' 5"  (1.651 m) 113.944 kg (251 lb 3.2 oz)    GENERAL: Well-developed, well-nourished female in no acute distress.  HEENT: normocephalic HEART: normal rate RESP: normal effort ABDOMEN: Soft, non-tender, gravid appropriate for gestational age EXTREMITIES: Nontender, no edema NEURO: alert and oriented SPECULUM EXAM: NEFG, physiologic discharge, no blood, cervix clean Dilation: 4 Effacement (%): 60 Cervical Position: Posterior Station: -3 Presentation: Vertex Exam by:: S.Carrera, RNC  FHT:  Baseline 130 , moderate variability, accelerations present, no decelerations Contractions: mild, irreg    Labs: Results for orders placed or performed during the hospital encounter of 05/10/14 (from the past 24 hour(s))  Urinalysis, Routine w reflex microscopic     Status: Abnormal   Collection Time: 05/10/14  4:40 PM  Result Value Ref Range   Color, Urine YELLOW YELLOW    APPearance CLEAR CLEAR   Specific Gravity, Urine 1.025 1.005 - 1.030   pH 7.0 5.0 - 8.0   Glucose, UA NEGATIVE NEGATIVE mg/dL   Hgb urine dipstick NEGATIVE NEGATIVE   Bilirubin Urine NEGATIVE NEGATIVE   Ketones, ur 40 (A) NEGATIVE mg/dL   Protein, ur NEGATIVE NEGATIVE mg/dL   Urobilinogen, UA 1.0 0.0 - 1.0 mg/dL   Nitrite NEGATIVE NEGATIVE   Leukocytes, UA SMALL (A) NEGATIVE  Protein / creatinine ratio, urine     Status: None   Collection Time: 05/10/14  4:40 PM  Result Value Ref Range   Creatinine, Urine 188.00 mg/dL   Total Protein, Urine 20 mg/dL   Protein Creatinine Ratio 0.11 0.00 - 0.15  Urine microscopic-add on     Status: Abnormal   Collection Time: 05/10/14  4:40 PM  Result Value Ref Range   Squamous Epithelial / LPF MANY (A) RARE   WBC, UA 3-6 <3 WBC/hpf   RBC / HPF 0-2 <3 RBC/hpf   Bacteria, UA MANY (A) RARE   Urine-Other MUCOUS PRESENT   CBC     Status: Abnormal   Collection Time: 05/10/14  4:59 PM  Result Value Ref Range   WBC 10.4 4.0 - 10.5 K/uL   RBC 4.20 3.87 - 5.11 MIL/uL   Hemoglobin 11.9 (L) 12.0 - 15.0 g/dL   HCT 40.9 (L) 81.1 - 91.4 %   MCV 83.3 78.0 - 100.0 fL   MCH 28.3 26.0 - 34.0 pg   MCHC 34.0 30.0 - 36.0 g/dL   RDW 78.2 95.6 - 21.3 %   Platelets 198 150 - 400 K/uL  Comprehensive metabolic panel     Status: Abnormal   Collection Time: 05/10/14  4:59 PM  Result Value Ref Range   Sodium 135 135 - 145 mmol/L   Potassium 3.6 3.5 - 5.1 mmol/L   Chloride 108 96 - 112 mEq/L   CO2 18 (L) 19 - 32 mmol/L   Glucose, Bld 76 70 - 99 mg/dL   BUN 7 6 - 23 mg/dL   Creatinine, Ser <0.86 (L) 0.50 - 1.10 mg/dL   Calcium 9.2 8.4 - 57.8 mg/dL   Total Protein 6.6 6.0 - 8.3 g/dL   Albumin 3.1 (L) 3.5 - 5.2 g/dL   AST 17 0 - 37 U/L   ALT 9 0 - 35 U/L  Alkaline Phosphatase 144 (H) 39 - 117 U/L   Total Bilirubin 0.5 0.3 - 1.2 mg/dL   GFR calc non Af Amer NOT CALCULATED >90 mL/min   GFR calc Af Amer NOT CALCULATED >90 mL/min   Anion gap 9 5 - 15     Imaging:  No results found. MAU Course:   Assessment: 1. Transient hypertension of pregnancy, antepartum, third trimester   2. Braxton Hicks contractions     Plan: Discharge home per consult w/ Dr. Claiborne Billingsallahan.  Labor and Pre-E precautions and fetal kick counts     Follow-up Information    Follow up with CALLAHAN, SIDNEY, DO In 1 week.   Specialty:  Obstetrics and Gynecology   Why:  As scheduled or sooner As needed if symptoms worsen   Contact information:   [address] [place] [address] [phone]       [facility] with [facility].   Why:  As needed in emergencies   Contact information:   [address] [id number] mc [place] [id number] [phone]       Medication List    STOP taking these medications        nitrofurantoin (macrocrystal-monohydrate) 100 MG capsule  Commonly known as:  MACROBID     terconazole 0.4 % vaginal cream  Commonly known as:  TERAZOL 7      TAKE these medications        [facility] PRENATAL GUMMY PO  Take 2 each by mouth daily.     ferrous sulfate 325 (65 FE) MG tablet  Take 325 mg by mouth at bedtime.     ranitidine 150 MG capsule  Commonly known as:  ZANTAC  Take 150 mg by mouth every evening.        [place] Mckenzi Buonomo, CNM 05/10/2014 7:17 PM

## 2014-05-10 NOTE — MAU Note (Signed)
Patient states she was in the office today for a regular visit and her blood pressure was elevated. Denies contractions, bleeding or leaking. Reports good fetal movement. Denies headache, swelling or visual changes.

## 2014-05-10 NOTE — Discharge Instructions (Signed)
Hypertension During Pregnancy °Hypertension, or high blood pressure, is when there is extra pressure inside your blood vessels that carry blood from the heart to the rest of your body (arteries). It can happen at any time in life, including pregnancy. Hypertension during pregnancy can cause problems for you and your baby. Your baby might not weigh as much as he or she should at birth or might be born early (premature). Very bad cases of hypertension during pregnancy can be life-threatening.  °Different types of hypertension can occur during pregnancy. These include: °· Chronic hypertension. This happens when a woman has hypertension before pregnancy and it continues during pregnancy. °· Gestational hypertension. This is when hypertension develops during pregnancy. °· Preeclampsia or toxemia of pregnancy. This is a very serious type of hypertension that develops only during pregnancy. It affects the whole body and can be very dangerous for both mother and baby.   °Gestational hypertension and preeclampsia usually go away after your baby is born. Your blood pressure will likely stabilize within 6 weeks. Women who have hypertension during pregnancy have a greater chance of developing hypertension later in life or with future pregnancies. °RISK FACTORS °There are certain factors that make it more likely for you to develop hypertension during pregnancy. These include: °· Having hypertension before pregnancy. °· Having hypertension during a previous pregnancy. °· Being overweight. °· Being older than 40 years. °· Being pregnant with more than one baby. °· Having diabetes or kidney problems. °SIGNS AND SYMPTOMS °Chronic and gestational hypertension rarely cause symptoms. Preeclampsia has symptoms, which may include: °· Increased protein in your urine. Your health care provider will check for this at every prenatal visit. °· Swelling of your hands and face. °· Rapid weight gain. °· Headaches. °· Visual changes. °· Being  bothered by light. °· Abdominal pain, especially in the upper right area. °· Chest pain. °· Shortness of breath. °· Increased reflexes. °· Seizures. These occur with a more severe form of preeclampsia, called eclampsia. °DIAGNOSIS  °You may be diagnosed with hypertension during a regular prenatal exam. At each prenatal visit, you may have: °· Your blood pressure checked. °· A urine test to check for protein in your urine. °The type of hypertension you are diagnosed with depends on when you developed it. It also depends on your specific blood pressure reading. °· Developing hypertension before 20 weeks of pregnancy is consistent with chronic hypertension. °· Developing hypertension after 20 weeks of pregnancy is consistent with gestational hypertension. °· Hypertension with increased urinary protein is diagnosed as preeclampsia. °· Blood pressure measurements that stay above 160 systolic or 110 diastolic are a sign of severe preeclampsia. °TREATMENT °Treatment for hypertension during pregnancy varies. Treatment depends on the type of hypertension and how serious it is. °· If you take medicine for chronic hypertension, you may need to switch medicines. °¨ Medicines called ACE inhibitors should not be taken during pregnancy. °¨ Low-dose aspirin may be suggested for women who have risk factors for preeclampsia. °· If you have gestational hypertension, you may need to take a blood pressure medicine that is safe during pregnancy. Your health care provider will recommend the correct medicine. °· If you have severe preeclampsia, you may need to be in the hospital. Health care providers will watch you and your baby very closely. You also may need to take medicine called magnesium sulfate to prevent seizures and lower blood pressure. °· Sometimes, an early delivery is needed. This may be the case if the condition worsens. It would be   done to protect you and your baby. The only cure for preeclampsia is delivery. °· Your health  care provider may recommend that you take one low-dose aspirin (81 mg) each day to help prevent high blood pressure during your pregnancy if you are at risk for preeclampsia. You may be at risk for preeclampsia if: °¨ You had preeclampsia or eclampsia during a previous pregnancy. °¨ Your baby did not grow as expected during a previous pregnancy. °¨ You experienced preterm birth with a previous pregnancy. °¨ You experienced a separation of the placenta from the uterus (placental abruption) during a previous pregnancy. °¨ You experienced the loss of your baby during a previous pregnancy. °¨ You are pregnant with more than one baby. °¨ You have other medical conditions, such as diabetes or an autoimmune disease. °HOME CARE INSTRUCTIONS °· Schedule and keep all of your regular prenatal care appointments. This is important. °· Take medicines only as directed by your health care provider. Tell your health care provider about all medicines you take. °· Eat as little salt as possible. °· Get regular exercise. °· Do not drink alcohol. °· Do not use tobacco products. °· Do not drink products with caffeine. °· Lie on your left side when resting. °SEEK IMMEDIATE MEDICAL CARE IF: °· You have severe abdominal pain. °· You have sudden swelling in your hands, ankles, or face. °· You gain 4 pounds (1.8 kg) or more in 1 week. °· You vomit repeatedly. °· You have vaginal bleeding. °· You do not feel your baby moving as much. °· You have a headache. °· You have blurred or double vision. °· You have muscle twitching or spasms. °· You have shortness of breath. °· You have blue fingernails or lips. °· You have blood in your urine. °MAKE SURE YOU: °· Understand these instructions. °· Will watch your condition. °· Will get help right away if you are not doing well or get worse. °Document Released: 01/14/2011 Document Revised: 09/12/2013 Document Reviewed: 11/25/2012 °ExitCare® Patient Information ©2015 [employer]. This information is not  intended to replace advice given to you by your health care provider. Make sure you discuss any questions you have with your health care provider. °Braxton Hicks Contractions °Contractions of the uterus can occur throughout pregnancy. Contractions are not always a sign that you are in labor.  °WHAT ARE BRAXTON HICKS CONTRACTIONS?  °Contractions that occur before labor are called Braxton Hicks contractions, or false labor. Toward the end of pregnancy (32-34 weeks), these contractions can develop more often and may become more forceful. This is not true labor because these contractions do not result in opening (dilatation) and thinning of the cervix. They are sometimes difficult to tell apart from true labor because these contractions can be forceful and people have different pain tolerances. You should not feel embarrassed if you go to the hospital with false labor. Sometimes, the only way to tell if you are in true labor is for your health care provider to look for changes in the cervix. °If there are no prenatal problems or other health problems associated with the pregnancy, it is completely safe to be sent home with false labor and await the onset of true labor. °HOW CAN YOU TELL THE DIFFERENCE BETWEEN TRUE AND FALSE LABOR? °False Labor °· The contractions of false labor are usually shorter and not as hard as those of true labor.   °· The contractions are usually irregular.   °· The contractions are often felt in the front of the lower   abdomen and in the groin.   °· The contractions may go away when you walk around or change positions while lying down.   °· The contractions get weaker and are shorter lasting as time goes on.   °· The contractions do not usually become progressively stronger, regular, and closer together as with true labor.   °True Labor °· Contractions in true labor last 30-70 seconds, become very regular, usually become more intense, and increase in frequency.   °· The contractions do not go  away with walking.   °· The discomfort is usually felt in the top of the uterus and spreads to the lower abdomen and low back.   °· True labor can be determined by your health care provider with an exam. This will show that the cervix is dilating and getting thinner.   °WHAT TO REMEMBER °· Keep up with your usual exercises and follow other instructions given by your health care provider.   °· Take medicines as directed by your health care provider.   °· Keep your regular prenatal appointments.   °· Eat and drink lightly if you think you are going into labor.   °· If Braxton Hicks contractions are making you uncomfortable:   °¨ Change your position from lying down or resting to walking, or from walking to resting.   °¨ Sit and rest in a tub of warm water.   °¨ Drink 2-3 glasses of water. Dehydration may cause these contractions.   °¨ Do slow and deep breathing several times an hour.   °WHEN SHOULD I SEEK IMMEDIATE MEDICAL CARE? °Seek immediate medical care if: °· Your contractions become stronger, more regular, and closer together.   °· You have fluid leaking or gushing from your vagina.   °· You have a fever.   °· You pass blood-tinged mucus.   °· You have vaginal bleeding.   °· You have continuous abdominal pain.   °· You have low back pain that you never had before.   °· You feel your baby's head pushing down and causing pelvic pressure.   °· Your baby is not moving as much as it used to.   °Document Released: 04/28/2005 Document Revised: 05/03/2013 Document Reviewed: 02/07/2013 °ExitCare® Patient Information ©2015 [employer]. This information is not intended to replace advice given to you by your health care provider. Make sure you discuss any questions you have with your health care provider. ° °

## 2014-05-12 NOTE — L&D Delivery Note (Signed)
Patient was C/C/+1 and pushed for 2hrs 10 minutes with epidural.   NSVD female infant with shoulder dystocia relieved with McRobert's, suprapubic pressure and delivery of posterior arm, Apgars 8/9, weight pending.   The patient had a R Lat episiotomy repaired with 2-0 vicryl and a left sulcal laceration repaired with 2-0 vicryl figure of 8. Fundus was firm. EBL was expected amount. Placenta was delivered intact, several pieces of membranes were teased free and gently removed assuring no remainders were left behind. Vagina was clear.  Baby was vigorous and doing skin to skin with mother.  Philip Aspen

## 2014-05-13 ENCOUNTER — Inpatient Hospital Stay (HOSPITAL_COMMUNITY)
Admission: AD | Admit: 2014-05-13 | Discharge: 2014-05-17 | DRG: 775 | Disposition: A | Discharge status: Home / Self Care | Payer: BLUE CROSS/BLUE SHIELD | Source: Ambulatory Visit | Attending: Obstetrics and Gynecology | Admitting: Obstetrics and Gynecology

## 2014-05-13 ENCOUNTER — Encounter (HOSPITAL_COMMUNITY): Payer: Self-pay

## 2014-05-13 DIAGNOSIS — O9902 Anemia complicating childbirth: Secondary | ICD-10-CM | POA: Diagnosis present

## 2014-05-13 DIAGNOSIS — O169 Unspecified maternal hypertension, unspecified trimester: Secondary | ICD-10-CM | POA: Diagnosis present

## 2014-05-13 DIAGNOSIS — O163 Unspecified maternal hypertension, third trimester: Secondary | ICD-10-CM

## 2014-05-13 DIAGNOSIS — D649 Anemia, unspecified: Secondary | ICD-10-CM | POA: Diagnosis present

## 2014-05-13 DIAGNOSIS — Z3A39 39 weeks gestation of pregnancy: Secondary | ICD-10-CM

## 2014-05-13 DIAGNOSIS — O133 Gestational [pregnancy-induced] hypertension without significant proteinuria, third trimester: Principal | ICD-10-CM | POA: Diagnosis present

## 2014-05-13 DIAGNOSIS — Z3483 Encounter for supervision of other normal pregnancy, third trimester: Secondary | ICD-10-CM | POA: Diagnosis present

## 2014-05-13 LAB — URINE MICROSCOPIC-ADD ON

## 2014-05-13 LAB — URINALYSIS, ROUTINE W REFLEX MICROSCOPIC
Bilirubin Urine: NEGATIVE
GLUCOSE, UA: NEGATIVE mg/dL
Hgb urine dipstick: NEGATIVE
Ketones, ur: 15 mg/dL — AB
Nitrite: NEGATIVE
PH: 6 (ref 5.0–8.0)
Protein, ur: NEGATIVE mg/dL
Specific Gravity, Urine: >1.03 — ABNORMAL HIGH (ref 1.005–1.030)
Urobilinogen, UA: 0.2 mg/dL (ref 0.0–1.0)

## 2014-05-13 LAB — COMPREHENSIVE METABOLIC PANEL
ALBUMIN: 2.8 g/dL — AB (ref 3.5–5.2)
ALK PHOS: 140 U/L — AB (ref 39–117)
ALT: 9 U/L (ref 0–35)
AST: 21 U/L (ref 0–37)
Anion gap: 8 (ref 5–15)
BILIRUBIN TOTAL: 0.4 mg/dL (ref 0.3–1.2)
BUN: 6 mg/dL (ref 6–23)
CHLORIDE: 111 meq/L (ref 96–112)
CO2: 19 mmol/L (ref 19–32)
Calcium: 9 mg/dL (ref 8.4–10.5)
Creatinine, Ser: 0.42 mg/dL — ABNORMAL LOW (ref 0.50–1.10)
GFR calc Af Amer: >90 mL/min (ref >90)
GFR calc non Af Amer: >90 mL/min (ref >90)
Glucose, Bld: 123 mg/dL — ABNORMAL HIGH (ref 70–99)
Potassium: 3.8 mmol/L (ref 3.5–5.1)
Sodium: 138 mmol/L (ref 135–145)
Total Protein: 6 g/dL (ref 6.0–8.3)

## 2014-05-13 LAB — PROTEIN / CREATININE RATIO, URINE
Creatinine, Urine: 153 mg/dL
Protein Creatinine Ratio: 0.13 (ref 0.00–0.15)
Total Protein, Urine: 20 mg/dL

## 2014-05-13 LAB — CBC
HCT: 33.6 % — ABNORMAL LOW (ref 36.0–46.0)
HEMOGLOBIN: 11.2 g/dL — AB (ref 12.0–15.0)
MCH: 27.9 pg (ref 26.0–34.0)
MCHC: 33.3 g/dL (ref 30.0–36.0)
MCV: 83.6 fL (ref 78.0–100.0)
Platelets: 210 10*3/uL (ref 150–400)
RBC: 4.02 MIL/uL (ref 3.87–5.11)
RDW: 15.1 % (ref 11.5–15.5)
WBC: 9.2 10*3/uL (ref 4.0–10.5)

## 2014-05-13 NOTE — MAU Note (Signed)
Pt presents complaining of irregular contractions and lower back pain. Denies vaginal bleeding or leaking of fluid. Hx of increased BP in office this week.

## 2014-05-13 NOTE — MAU Note (Signed)
Report called to [employer] on birthing suites and strip reviewed. Will go to [phone]

## 2014-05-13 NOTE — MAU Provider Note (Signed)
Chief Complaint:  Labor Eval   First Provider Initiated Contact with Patient 05/13/14 2314     HPI: Lori Li is a 24 y.o. G2P0010 at [redacted]w[redacted]d who presents to maternity admissions reporting irregular contractions. BP elevated. Was first found to be elevated 05/10/14. Cervix 4 at last MAU visit. 12/30. Denies HA , vision changes, epigastric pain, leakage of fluid or vaginal bleeding. Good fetal movement.   Pregnancy Course: Uncomplicated.  Past Medical History: Past Medical History  Diagnosis Date  . Anemia     Past obstetric history: OB History  Gravida Para Term Preterm AB SAB TAB Ectopic Multiple Living  0    # Outcome Date GA Lbr Len/2nd Weight Sex Delivery Anes PTL Lv  2 Current           1 SAB               Past Surgical History: Past Surgical History  Procedure Laterality Date  . No past surgeries       Family History: Family History  Problem Relation Age of Onset  . Heart disease Mother   . Diabetes Father     Social History: History  Substance Use Topics  . Smoking status: Never Smoker   . Smokeless tobacco: Not on file  . Alcohol Use: No    Allergies: No Known Allergies  Meds:  Prescriptions prior to admission  Medication Sig Dispense Refill Last Dose  . ferrous sulfate 325 (65 FE) MG tablet Take 325 mg by mouth at bedtime.    05/13/2014 at Unknown time  . Prenatal Vit-Min-FA-Fish Oil ([facility] PRENATAL GUMMY PO) Take 2 each by mouth daily.   05/13/2014 at Unknown time  . ranitidine (ZANTAC) 150 MG capsule Take 150 mg by mouth every evening.    05/13/2014 at Unknown time    ROS: Pertinent findings in history of present illness.  Physical Exam  Blood pressure 151/91, pulse 106, temperature 98 F (36.7 C), temperature source Oral, resp. rate 18, height  (1.651 m), weight 116.665 kg (257 lb 3.2 oz). Patient Vitals for the past 24 hrs:  BP Temp Temp src Pulse Resp Height Weight  05/13/14 2220 151/91 mmHg - - 106 - - -  05/13/14 2205 146/93  mmHg - - 110 - - -  05/13/14 2150 139/91 mmHg - - 109 - - -  05/13/14 2146 148/94 mmHg - - 107 - - -  05/13/14 2131 142/88 mmHg - - (!) 122 - - -  05/13/14 2128 156/96 mmHg 98 F (36.7 C) Oral 112 18  (1.651 m) 116.665 kg (257 lb 3.2 oz)    GENERAL: Well-developed, well-nourished female in no acute distress.  HEENT: normocephalic HEART: normal rate RESP: normal effort ABDOMEN: Soft, non-tender, gravid appropriate for gestational age EXTREMITIES: Nontender, 1+ edema NEURO: alert and oriented SPECULUM EXAM: Deferred  Dilation: 4 Effacement (%): 60 Cervical Position: Posterior Station: -3 Presentation: Vertex Exam by:: Sharen Hint RN  FHT:  Baseline 140 , moderate variability, accelerations present, no decelerations Contractions: UI, difficult to trace due to maternal body habitus.   Labs: Results for orders placed or performed during the hospital encounter of 05/13/14 (from the past 24 hour(s))  Urinalysis, Routine w reflex microscopic     Status: Abnormal   Collection Time: 05/13/14  9:25 PM  Result Value Ref Range   Color, Urine YELLOW YELLOW   APPearance HAZY (A) CLEAR   Specific Gravity, Urine >1.030 (H)  1.005 - 1.030   pH 6.0 5.0 - 8.0   Glucose, UA NEGATIVE NEGATIVE mg/dL   Hgb urine dipstick NEGATIVE NEGATIVE   Bilirubin Urine NEGATIVE NEGATIVE   Ketones, ur 15 (A) NEGATIVE mg/dL   Protein, ur NEGATIVE NEGATIVE mg/dL   Urobilinogen, UA 0.2 0.0 - 1.0 mg/dL   Nitrite NEGATIVE NEGATIVE   Leukocytes, UA MODERATE (A) NEGATIVE  Protein / creatinine ratio, urine     Status: None   Collection Time: 05/13/14  9:25 PM  Result Value Ref Range   Creatinine, Urine 153.00 mg/dL   Total Protein, Urine 20 mg/dL   Protein Creatinine Ratio 0.13 0.00 - 0.15  Urine microscopic-add on     Status: Abnormal   Collection Time: 05/13/14  9:25 PM  Result Value Ref Range   Squamous Epithelial / LPF FEW (A) RARE   WBC, UA 3-6 <3 WBC/hpf   RBC / HPF 0-2 <3 RBC/hpf   Bacteria,  UA FEW (A) RARE   Urine-Other MUCOUS PRESENT   CBC     Status: Abnormal   Collection Time: 05/13/14 10:00 PM  Result Value Ref Range   WBC 9.2 4.0 - 10.5 K/uL   RBC 4.02 3.87 - 5.11 MIL/uL   Hemoglobin 11.2 (L) 12.0 - 15.0 g/dL   HCT 04.5 (L) 40.9 - 81.1 %   MCV 83.6 78.0 - 100.0 fL   MCH 27.9 26.0 - 34.0 pg   MCHC 33.3 30.0 - 36.0 g/dL   RDW 91.4 78.2 - 95.6 %   Platelets 210 150 - 400 K/uL  Comprehensive metabolic panel     Status: Abnormal   Collection Time: 05/13/14 10:00 PM  Result Value Ref Range   Sodium 138 135 - 145 mmol/L   Potassium 3.8 3.5 - 5.1 mmol/L   Chloride 111 96 - 112 mEq/L   CO2 19 19 - 32 mmol/L   Glucose, Bld 123 (H) 70 - 99 mg/dL   BUN 6 6 - 23 mg/dL   Creatinine, Ser 2.13 (L) 0.50 - 1.10 mg/dL   Calcium 9.0 8.4 - 08.6 mg/dL   Total Protein 6.0 6.0 - 8.3 g/dL   Albumin 2.8 (L) 3.5 - 5.2 g/dL   AST 21 0 - 37 U/L   ALT 9 0 - 35 U/L   Alkaline Phosphatase 140 (H) 39 - 117 U/L   Total Bilirubin 0.4 0.3 - 1.2 mg/dL   GFR calc non Af Amer >90 >90 mL/min   GFR calc Af Amer >90 >90 mL/min   Anion gap 8 5 - 15    Imaging:  No results found. MAU Course:   Assessment: 1. Hypertension complicating pregnancy, third trimester    Plan: Admit to L&D p[er consult w/ Dr. Claiborne Billings. Routine L&D orders. May have epidural PRN. If no cervical change in 2 hour start Pitocin 2x2.   [place], CNM 05/13/2014 11:15 PM

## 2014-05-14 ENCOUNTER — Inpatient Hospital Stay (HOSPITAL_COMMUNITY): Payer: BLUE CROSS/BLUE SHIELD | Admitting: Anesthesiology

## 2014-05-14 DIAGNOSIS — O169 Unspecified maternal hypertension, unspecified trimester: Secondary | ICD-10-CM | POA: Diagnosis present

## 2014-05-14 LAB — RPR

## 2014-05-14 LAB — TYPE AND SCREEN
ABO/RH(D): A POS
Antibody Screen: NEGATIVE

## 2014-05-14 LAB — ABO/RH: ABO/RH(D): A POS

## 2014-05-14 MED ORDER — OXYCODONE-ACETAMINOPHEN 5-325 MG PO TABS: 1.0000 | ORAL_TABLET | ORAL | Status: DC | PRN

## 2014-05-14 MED ORDER — TERBUTALINE SULFATE 1 MG/ML IJ SOLN: 0.2500 mg | Freq: Once | INTRAMUSCULAR | Status: AC | PRN

## 2014-05-14 MED ORDER — ONDANSETRON HCL 4 MG/2ML IJ SOLN
4.0000 mg | Freq: Four times a day (QID) | INTRAMUSCULAR | Status: DC | PRN
  Administered 2014-05-14: 4 mg via INTRAVENOUS
  Filled 2014-05-14 (×2): qty 2

## 2014-05-14 MED ORDER — CITRIC ACID-SODIUM CITRATE 334-500 MG/5ML PO SOLN: 30.0000 mL | ORAL | Status: DC | PRN

## 2014-05-14 MED ORDER — EPHEDRINE 5 MG/ML INJ
10.0000 mg | INTRAVENOUS | Status: DC | PRN
  Filled 2014-05-14: qty 2

## 2014-05-14 MED ORDER — PHENYLEPHRINE 40 MCG/ML (10ML) SYRINGE FOR IV PUSH (FOR BLOOD PRESSURE SUPPORT)
80.0000 ug | PREFILLED_SYRINGE | INTRAVENOUS | Status: DC | PRN
  Filled 2014-05-14: qty 2

## 2014-05-14 MED ORDER — FENTANYL 2.5 MCG/ML BUPIVACAINE 1/10 % EPIDURAL INFUSION (WH - ANES)
14.0000 mL/h | INTRAMUSCULAR | Status: DC | PRN
  Administered 2014-05-14 (×3): 14 mL/h via EPIDURAL
  Filled 2014-05-14 (×3): qty 125

## 2014-05-14 MED ORDER — PHENYLEPHRINE 40 MCG/ML (10ML) SYRINGE FOR IV PUSH (FOR BLOOD PRESSURE SUPPORT)
80.0000 ug | PREFILLED_SYRINGE | INTRAVENOUS | Status: DC | PRN
  Filled 2014-05-14: qty 2
  Filled 2014-05-14: qty 10

## 2014-05-14 MED ORDER — OXYTOCIN BOLUS FROM INFUSION: 500.0000 mL | INTRAVENOUS | Status: DC

## 2014-05-14 MED ORDER — LIDOCAINE HCL (PF) 1 % IJ SOLN
30.0000 mL | INTRAMUSCULAR | Status: AC | PRN
  Administered 2014-05-15: 30 mL via SUBCUTANEOUS
  Filled 2014-05-14: qty 30

## 2014-05-14 MED ORDER — DIPHENHYDRAMINE HCL 50 MG/ML IJ SOLN
12.5000 mg | INTRAMUSCULAR | Status: DC | PRN
  Administered 2014-05-14: 12.5 mg via INTRAVENOUS
  Filled 2014-05-14: qty 1

## 2014-05-14 MED ORDER — FLEET ENEMA 7-19 GM/118ML RE ENEM: 1.0000 | ENEMA | RECTAL | Status: DC | PRN

## 2014-05-14 MED ORDER — ACETAMINOPHEN 325 MG PO TABS
650.0000 mg | ORAL_TABLET | ORAL | Status: DC | PRN
Start: 2014-05-14 — End: 2014-05-15

## 2014-05-14 MED ORDER — LACTATED RINGERS IV SOLN
500.0000 mL | INTRAVENOUS | Status: DC | PRN
  Administered 2014-05-14: 500 mL via INTRAVENOUS

## 2014-05-14 MED ORDER — FENTANYL 2.5 MCG/ML BUPIVACAINE 1/10 % EPIDURAL INFUSION (WH - ANES)
INTRAMUSCULAR | Status: DC | PRN
  Administered 2014-05-14: 14 mL/h via EPIDURAL

## 2014-05-14 MED ORDER — OXYCODONE-ACETAMINOPHEN 5-325 MG PO TABS: 2.0000 | ORAL_TABLET | ORAL | Status: DC | PRN

## 2014-05-14 MED ORDER — LIDOCAINE HCL (PF) 1 % IJ SOLN
INTRAMUSCULAR | Status: DC | PRN
  Administered 2014-05-14: 5 mL
  Administered 2014-05-14: 3 mL
  Administered 2014-05-14: 5 mL

## 2014-05-14 MED ORDER — OXYTOCIN 40 UNITS IN LACTATED RINGERS INFUSION - SIMPLE MED
62.5000 mL/h | INTRAVENOUS | Status: DC
  Filled 2014-05-14: qty 1000

## 2014-05-14 MED ORDER — OXYTOCIN 40 UNITS IN LACTATED RINGERS INFUSION - SIMPLE MED
1.0000 m[IU]/min | INTRAVENOUS | Status: DC
  Administered 2014-05-14: 2 m[IU]/min via INTRAVENOUS
  Filled 2014-05-14: qty 1000

## 2014-05-14 MED ORDER — LACTATED RINGERS IV SOLN
500.0000 mL | Freq: Once | INTRAVENOUS | Status: AC
  Administered 2014-05-14: 500 mL via INTRAVENOUS

## 2014-05-14 MED ORDER — LACTATED RINGERS IV SOLN
INTRAVENOUS | Status: DC
Start: 2014-05-14 — End: 2014-05-15
  Administered 2014-05-14 (×3): via INTRAVENOUS

## 2014-05-14 NOTE — Anesthesia Procedure Notes (Signed)
Epidural Patient location during procedure: OB  Staffing Anesthesiologist: Marlisa Caridi Performed by: anesthesiologist   Preanesthetic Checklist Completed: patient identified, site marked, surgical consent, pre-op evaluation, timeout performed, IV checked, risks and benefits discussed and monitors and equipment checked  Epidural Patient position: sitting Prep: ChloraPrep Patient monitoring: heart rate, continuous pulse ox and blood pressure Approach: right paramedian Location: L3-L4 Injection technique: LOR saline  Needle:  Needle type: Tuohy  Needle gauge: 17 G Needle length: 9 cm and 9 Needle insertion depth: 8 cm Catheter type: closed end flexible Catheter size: 20 Guage Catheter at skin depth: 13 cm Test dose: negative  Assessment Events: blood not aspirated, injection not painful, no injection resistance, negative IV test and no paresthesia  Additional Notes   Patient tolerated the insertion well without complications.   

## 2014-05-14 NOTE — H&P (Addendum)
24 y.o. [redacted]w[redacted]d  G2P0010 comes in c/o ctx.  Seen 12/30 in MAU for elevated BPs sent from office.  Labs normal, pt discharged home.  Presents now with ctx and elevated BPs, No HA/vision changes/RUQ pain.  Otherwise has good fetal movement and no bleeding.  Past Medical History  Diagnosis Date  . Anemia     this pregnancy - on iron    Past Surgical History  Procedure Laterality Date  . No past surgeries      OB History  Gravida Para Term Preterm AB SAB TAB Ectopic Multiple Living  0    # Outcome Date GA Lbr Len/2nd Weight Sex Delivery Anes PTL Lv  2 Current           1 SAB               History   Social History  . Marital Status: Married    Spouse Name: N/A    Number of Children: N/A  . Years of Education: N/A   Occupational History  . Not on file.   Social History Main Topics  . Smoking status: Never Smoker   . Smokeless tobacco: Never Used  . Alcohol Use: No  . Drug Use: No  . Sexual Activity: Yes    Birth Control/ Protection: None   Other Topics Concern  . Not on file   Social History Narrative   Review of patient's allergies indicates no known allergies.    Prenatal Transfer Tool  Maternal Diabetes: No, elevated one hr, normal 3hr Genetic Screening: Declined Maternal Ultrasounds/Referrals: Normal Fetal Ultrasounds or other Referrals:  None Maternal Substance Abuse:  No Significant Maternal Medications:  None Significant Maternal Lab Results: Lab values include: Group B Strep negative  Other PNC: obesity, s>d  weeks 5#8    Filed Vitals:   05/14/14 0931  BP: 110/70  Pulse: 105  Temp:   Resp:      Lungs/Cor:  NAD Abdomen:  soft, gravid Ex:  no cords, erythema SVE:  4/60/-3 at admission now 5/80/-1 FHTs:  150, good STV, 10x10 Toco:  q 1.5-5 currently   A/P   Admit for IOL for GHTN, irreg ctx  GBS Neg  AROM for meconium this am  Pitocin 2x2  IUPC placed d/t difficulty tracing and some HR deceleration prior, currently good  variability no decels  [place]

## 2014-05-14 NOTE — Anesthesia Preprocedure Evaluation (Signed)
Anesthesia Evaluation  Patient identified by MRN, date of birth, ID band Patient awake    Reviewed: Allergy & Precautions, H&P , NPO status , Patient's Chart, lab work & pertinent test results  History of Anesthesia Complications Negative for: history of anesthetic complications  Airway Mallampati: II  TM Distance: >3 FB Neck ROM: full    Dental no notable dental hx. (+) Teeth Intact   Pulmonary neg pulmonary ROS,  breath sounds clear to auscultation  Pulmonary exam normal       Cardiovascular hypertension, negative cardio ROS  Rhythm:regular Rate:Normal     Neuro/Psych negative neurological ROS  negative psych ROS   GI/Hepatic negative GI ROS, Neg liver ROS,   Endo/Other  negative endocrine ROSMorbid obesity  Renal/GU negative Renal ROS  negative genitourinary   Musculoskeletal   Abdominal Normal abdominal exam  (+)   Peds  Hematology negative hematology ROS (+)   Anesthesia Other Findings   Reproductive/Obstetrics (+) Pregnancy                             Anesthesia Physical Anesthesia Plan  ASA: II  Anesthesia Plan: Epidural   Post-op Pain Management:    Induction:   Airway Management Planned:   Additional Equipment:   Intra-op Plan:   Post-operative Plan:   Informed Consent: I have reviewed the patients History and Physical, chart, labs and discussed the procedure including the risks, benefits and alternatives for the proposed anesthesia with the patient or authorized representative who has indicated his/her understanding and acceptance.     Plan Discussed with:   Anesthesia Plan Comments:         Anesthesia Quick Evaluation

## 2014-05-15 ENCOUNTER — Encounter (HOSPITAL_COMMUNITY): Payer: Self-pay | Admitting: *Deleted

## 2014-05-15 LAB — CBC
HCT: 28.4 % — ABNORMAL LOW (ref 36.0–46.0)
Hemoglobin: 9.6 g/dL — ABNORMAL LOW (ref 12.0–15.0)
MCH: 28.4 pg (ref 26.0–34.0)
MCHC: 33.8 g/dL (ref 30.0–36.0)
MCV: 84 fL (ref 78.0–100.0)
PLATELETS: 203 10*3/uL (ref 150–400)
RBC: 3.38 MIL/uL — ABNORMAL LOW (ref 3.87–5.11)
RDW: 15.4 % (ref 11.5–15.5)
WBC: 17.8 10*3/uL — ABNORMAL HIGH (ref 4.0–10.5)

## 2014-05-15 MED ORDER — BENZOCAINE-MENTHOL 20-0.5 % EX AERO
1.0000 "application " | INHALATION_SPRAY | CUTANEOUS | Status: DC | PRN
  Filled 2014-05-15: qty 56

## 2014-05-15 MED ORDER — PRENATAL MULTIVITAMIN CH
1.0000 | ORAL_TABLET | Freq: Every day | ORAL | Status: DC
  Administered 2014-05-15: 1 via ORAL

## 2014-05-15 MED ORDER — COMPLETENATE 29-1 MG PO CHEW
1.0000 | CHEWABLE_TABLET | Freq: Every day | ORAL | Status: DC
  Administered 2014-05-15 – 2014-05-16 (×2): 1 via ORAL
  Filled 2014-05-15 (×3): qty 1

## 2014-05-15 MED ORDER — SENNOSIDES-DOCUSATE SODIUM 8.6-50 MG PO TABS
2.0000 | ORAL_TABLET | ORAL | Status: DC
  Administered 2014-05-15 – 2014-05-16 (×2): 2 via ORAL
  Filled 2014-05-15 (×2): qty 2

## 2014-05-15 MED ORDER — OXYCODONE-ACETAMINOPHEN 5-325 MG PO TABS: 2.0000 | ORAL_TABLET | ORAL | Status: DC | PRN

## 2014-05-15 MED ORDER — ONDANSETRON HCL 4 MG/2ML IJ SOLN: 4.0000 mg | INTRAMUSCULAR | Status: DC | PRN

## 2014-05-15 MED ORDER — DIPHENHYDRAMINE HCL 25 MG PO CAPS: 25.0000 mg | ORAL_CAPSULE | Freq: Four times a day (QID) | ORAL | Status: DC | PRN

## 2014-05-15 MED ORDER — SIMETHICONE 80 MG PO CHEW: 80.0000 mg | CHEWABLE_TABLET | ORAL | Status: DC | PRN

## 2014-05-15 MED ORDER — OXYCODONE-ACETAMINOPHEN 5-325 MG PO TABS: 1.0000 | ORAL_TABLET | ORAL | Status: DC | PRN

## 2014-05-15 MED ORDER — TETANUS-DIPHTH-ACELL PERTUSSIS 5-2.5-18.5 LF-MCG/0.5 IM SUSP: 0.5000 mL | Freq: Once | INTRAMUSCULAR | Status: DC

## 2014-05-15 MED ORDER — DIBUCAINE 1 % RE OINT: 1.0000 "application " | TOPICAL_OINTMENT | RECTAL | Status: DC | PRN

## 2014-05-15 MED ORDER — WITCH HAZEL-GLYCERIN EX PADS: 1.0000 "application " | MEDICATED_PAD | CUTANEOUS | Status: DC | PRN

## 2014-05-15 MED ORDER — LANOLIN HYDROUS EX OINT: TOPICAL_OINTMENT | CUTANEOUS | Status: DC | PRN

## 2014-05-15 MED ORDER — IBUPROFEN 100 MG/5ML PO SUSP
600.0000 mg | Freq: Four times a day (QID) | ORAL | Status: DC
  Administered 2014-05-15 – 2014-05-17 (×8): 600 mg via ORAL
  Filled 2014-05-15 (×11): qty 30

## 2014-05-15 MED ORDER — ZOLPIDEM TARTRATE 5 MG PO TABS: 5.0000 mg | ORAL_TABLET | Freq: Every evening | ORAL | Status: DC | PRN

## 2014-05-15 MED ORDER — ONDANSETRON HCL 4 MG PO TABS: 4.0000 mg | ORAL_TABLET | ORAL | Status: DC | PRN

## 2014-05-15 NOTE — Lactation Note (Signed)
This note was copied from the chart of Lori Kelsye Loomer. Lactation Consultation Note; Initial visit with mom. Baby now 8 hours old. Mom is very sleepy and baby is asleep in bassinet. Reports baby fed earlier this morning. BF brochure left with mom but will reviewed with mom when she is more awake. No questions at present. To call for assist prn.  Patient Name: Lori Li [id number] Date: 05/15/2014 Reason for consult: Initial assessment   Maternal Data Formula Feeding for Exclusion: No  Feeding    LATCH Score/Interventions                      Lactation Tools Discussed/Used     Consult Status Consult Status: Follow-up Date: 05/15/14 Follow-up type: In-patient    Pamelia Hoit 05/15/2014, 10:30 AM

## 2014-05-15 NOTE — Lactation Note (Signed)
This note was copied from the chart of Lori Li. Lactation Consultation Note: Mom reports that baby has just finished feeding for 15 min. Is using NS. RN assisted mom with latch. Colostrum noted in NS when baby came off the breast. Reports no pain with nursing. BF brochure given with resources for support after DC. Reviewed feeding cues and encouraged to feed whenever she sees them, No questions at present. To call prn  Patient Name: Lori Li [id number] Date: 05/15/2014 Reason for consult: Follow-up assessment   Maternal Data Formula Feeding for Exclusion: No  Feeding    LATCH Score/Interventions                      Lactation Tools Discussed/Used     Consult Status Consult Status: Follow-up Date: 05/16/14 Follow-up type: In-patient    Pamelia Hoit 05/15/2014, 1:57 PM

## 2014-05-15 NOTE — Anesthesia Postprocedure Evaluation (Signed)
Anesthesia Post Note  Patient: Lori Li  Procedure(s) Performed: * No procedures listed *  Anesthesia type: Epidural  Patient location: Mother/Baby  Post pain: Pain level controlled  Post assessment: Post-op Vital signs reviewed  Last Vitals:  Filed Vitals:   05/15/14 0550  BP: 118/67  Pulse: 110  Temp: 36.8 C  Resp: 20    Post vital signs: Reviewed  Level of consciousness:alert  Complications: No apparent anesthesia complications

## 2014-05-16 NOTE — Progress Notes (Signed)
Post Partum Day 1 Subjective: no complaints, up ad lib, voiding, tolerating PO, + flatus and breast feeding  Objective: Blood pressure 117/61, pulse 100, temperature 98 F (36.7 C), temperature source Oral, resp. rate 18, height 5\' 5"  (1.651 m), weight 116.665 kg (257 lb 3.2 oz), SpO2 100 %, unknown if currently breastfeeding.  Physical Exam:  General: alert, cooperative, appears stated age and no distress Lochia: appropriate Uterine Fundus: firm perineum: healing well, no significant drainage, no dehiscence, no significant erythema DVT Evaluation: No evidence of DVT seen on physical exam. Negative Homan's sign. No cords or calf tenderness.   Recent Labs  05/13/14 2200 05/15/14 0654  HGB 11.2* 9.6*  HCT 33.6* 28.4*    Assessment/Plan: Breastfeeding and Contraception will discuss at post partum visit  Patient wants to go home today, she can be discharged as long as the baby is being discharged home.    LOS: 3 days   Davidson Palmieri STACIA 05/16/2014, 9:06 AM

## 2014-05-17 MED ORDER — OXYCODONE-ACETAMINOPHEN 5-325 MG PO TABS: 1.0000 | ORAL_TABLET | Freq: Four times a day (QID) | ORAL | Status: DC | PRN

## 2014-05-17 NOTE — Discharge Instructions (Signed)

## 2014-05-17 NOTE — Discharge Summary (Signed)
Obstetric Discharge Summary Reason for Admission: onset of labor and and GHTN Prenatal Procedures: NST Intrapartum Procedures: spontaneous vaginal delivery and episiotomy ML Postpartum Procedures: none Complications-Operative and Postpartum: ML episiotomy degree perineal laceration and shoulder dystocia relieved by McRoberts/suprapubic pressure HEMOGLOBIN  Date Value Ref Range Status  05/15/2014 9.6* 12.0 - 15.0 g/dL Final   HCT  Date Value Ref Range Status  05/15/2014 28.4* 36.0 - 46.0 % Final    Physical Exam:  General: alert and cooperative Lochia: appropriate Uterine Fundus: firm DVT Evaluation: No evidence of DVT seen on physical exam. Negative Homan's sign.  Discharge Diagnoses: Term Pregnancy-delivered  Discharge Information: Date: 05/17/2014 Activity: pelvic rest Diet: routine Medications: PNV, Ibuprofen, Iron and Percocet Condition: stable Instructions: refer to practice specific booklet Discharge to: home Follow-up Information    Follow up with CALLAHAN, SIDNEY, DO In 4 weeks.   Specialty:  Obstetrics and Gynecology   Contact information:   [address] [place] [id number] [phone]       Newborn Data: Live born female  Birth Weight: 8 lb 1.8 oz (3680 g) APGAR: 8, 9  Home with mother.  Marlow BaarsCLARK, Maurene Hollin 05/17/2014, 8:15 AM

## 2014-05-17 NOTE — Lactation Note (Signed)
This note was copied from the chart of Lori Li. Lactation Consultation Note  Patient Name: Lori Li ZOXWR'UToday's Date: 05/17/2014 Reason for consult: Follow-up assessment;Hyperbilirubinemia  Mom reports that baby is content w/feedings (she's offering breast first, then giving 15-2620mL of formula).  Mom plans to continue offering formula until jaundice clears. Mom is using a nipple shield when baby is at breast & Mom hears swallows and sees colostrum in NS at the end of feedings.  Mom to pump 4-6 times/day to help support milk supply.  Mom has a DEBP at home.  Mom has no breast complaints. Mom reminded of outpatient BF support.  Lori HareRichey, [facility] Li [facility] 05/17/2014, 9:03 AM

## 2014-05-17 NOTE — Progress Notes (Signed)
Patient is doing well.  She is ambulating, voiding, tolerating PO.  Pain control is good.  Lochia is appropriate  Filed Vitals:   05/15/14 0944 05/15/14 1808 05/16/14 0619 05/17/14 0625  BP: 113/56 131/71 117/61 110/73  Pulse: 76 100  82  Temp: 97.5 F (36.4 C) 97.9 F (36.6 C) 98 F (36.7 C) 98 F (36.7 C)  TempSrc: Oral Oral  Oral  Resp: 16  18 18   Height:      Weight:      SpO2:  100%      NAD Fundus firm Ext: 1+ edema b/l  Lab Results  Component Value Date   WBC 17.8* 05/15/2014   HGB 9.6* 05/15/2014   HCT 28.4* 05/15/2014   MCV 84.0 05/15/2014   PLT 203 05/15/2014      A/P 23 y.o. G2P1011 PPD#2. Routine care.   Expect d/c today, meeting all goals.    [place], [facility]ANNA

## 2014-05-22 NOTE — Progress Notes (Signed)
Ur chart review completed.  

## 2014-08-17 ENCOUNTER — Encounter (HOSPITAL_COMMUNITY): Payer: Self-pay | Admitting: Emergency Medicine

## 2014-08-17 ENCOUNTER — Emergency Department (HOSPITAL_COMMUNITY): Payer: BLUE CROSS/BLUE SHIELD

## 2014-08-17 ENCOUNTER — Emergency Department (HOSPITAL_COMMUNITY)
Admission: EM | Admit: 2014-08-17 | Discharge: 2014-08-17 | Disposition: A | Discharge status: Home / Self Care | Payer: BLUE CROSS/BLUE SHIELD | Attending: Emergency Medicine | Admitting: Emergency Medicine

## 2014-08-17 DIAGNOSIS — Z3202 Encounter for pregnancy test, result negative: Secondary | ICD-10-CM | POA: Insufficient documentation

## 2014-08-17 DIAGNOSIS — Z79899 Other long term (current) drug therapy: Secondary | ICD-10-CM | POA: Insufficient documentation

## 2014-08-17 DIAGNOSIS — R110 Nausea: Secondary | ICD-10-CM | POA: Insufficient documentation

## 2014-08-17 DIAGNOSIS — R1010 Upper abdominal pain, unspecified: Secondary | ICD-10-CM | POA: Diagnosis present

## 2014-08-17 DIAGNOSIS — R1013 Epigastric pain: Secondary | ICD-10-CM | POA: Insufficient documentation

## 2014-08-17 LAB — URINALYSIS, ROUTINE W REFLEX MICROSCOPIC
Bilirubin Urine: NEGATIVE
Glucose, UA: NEGATIVE mg/dL
HGB URINE DIPSTICK: NEGATIVE
Ketones, ur: NEGATIVE mg/dL
LEUKOCYTES UA: NEGATIVE
Nitrite: NEGATIVE
PH: 6 (ref 5.0–8.0)
PROTEIN: NEGATIVE mg/dL
Specific Gravity, Urine: 1.031 — ABNORMAL HIGH (ref 1.005–1.030)
UROBILINOGEN UA: 0.2 mg/dL (ref 0.0–1.0)

## 2014-08-17 LAB — COMPREHENSIVE METABOLIC PANEL
ALBUMIN: 3.9 g/dL (ref 3.5–5.2)
ALT: 38 U/L — ABNORMAL HIGH (ref 0–35)
AST: 38 U/L — ABNORMAL HIGH (ref 0–37)
Alkaline Phosphatase: 82 U/L (ref 39–117)
Anion gap: 9 (ref 5–15)
BUN: 18 mg/dL (ref 6–23)
CHLORIDE: 106 mmol/L (ref 96–112)
CO2: 24 mmol/L (ref 19–32)
CREATININE: 0.47 mg/dL — AB (ref 0.50–1.10)
Calcium: 9.3 mg/dL (ref 8.4–10.5)
GFR calc Af Amer: >90 mL/min (ref >90)
GFR calc non Af Amer: >90 mL/min (ref >90)
Glucose, Bld: 98 mg/dL (ref 70–99)
Potassium: 3.6 mmol/L (ref 3.5–5.1)
Sodium: 139 mmol/L (ref 135–145)
Total Bilirubin: 0.5 mg/dL (ref 0.3–1.2)
Total Protein: 7.1 g/dL (ref 6.0–8.3)

## 2014-08-17 LAB — CBC WITH DIFFERENTIAL/PLATELET
Basophils Absolute: 0 10*3/uL (ref 0.0–0.1)
Basophils Relative: 1 % (ref 0–1)
Eosinophils Absolute: 0.1 10*3/uL (ref 0.0–0.7)
Eosinophils Relative: 2 % (ref 0–5)
HCT: 36.4 % (ref 36.0–46.0)
Hemoglobin: 12.1 g/dL (ref 12.0–15.0)
LYMPHS ABS: 1.9 10*3/uL (ref 0.7–4.0)
LYMPHS PCT: 45 % (ref 12–46)
MCH: 26.4 pg (ref 26.0–34.0)
MCHC: 33.2 g/dL (ref 30.0–36.0)
MCV: 79.5 fL (ref 78.0–100.0)
Monocytes Absolute: 0.5 10*3/uL (ref 0.1–1.0)
Monocytes Relative: 11 % (ref 3–12)
NEUTROS PCT: 41 % — AB (ref 43–77)
Neutro Abs: 1.7 10*3/uL (ref 1.7–7.7)
PLATELETS: 171 10*3/uL (ref 150–400)
RBC: 4.58 MIL/uL (ref 3.87–5.11)
RDW: 12.9 % (ref 11.5–15.5)
WBC: 4.2 10*3/uL (ref 4.0–10.5)

## 2014-08-17 LAB — POC URINE PREG, ED: Preg Test, Ur: NEGATIVE

## 2014-08-17 MED ORDER — ONDANSETRON HCL 4 MG/2ML IJ SOLN
4.0000 mg | Freq: Once | INTRAMUSCULAR | Status: AC
  Administered 2014-08-17: 4 mg via INTRAVENOUS
  Filled 2014-08-17: qty 2

## 2014-08-17 MED ORDER — ONDANSETRON HCL 4 MG PO TABS: 4.0000 mg | ORAL_TABLET | Freq: Four times a day (QID) | ORAL | Status: DC

## 2014-08-17 MED ORDER — OXYCODONE-ACETAMINOPHEN 5-325 MG PO TABS: 1.0000 | ORAL_TABLET | Freq: Four times a day (QID) | ORAL | Status: DC | PRN

## 2014-08-17 MED ORDER — PANTOPRAZOLE SODIUM 40 MG IV SOLR
40.0000 mg | Freq: Once | INTRAVENOUS | Status: AC
  Administered 2014-08-17: 40 mg via INTRAVENOUS
  Filled 2014-08-17: qty 40

## 2014-08-17 MED ORDER — SODIUM CHLORIDE 0.9 % IV BOLUS (SEPSIS)
1000.0000 mL | Freq: Once | INTRAVENOUS | Status: AC
  Administered 2014-08-17: 1000 mL via INTRAVENOUS

## 2014-08-17 NOTE — Discharge Instructions (Signed)
Biliary Colic  °Biliary colic is a steady or irregular pain in the upper abdomen. It is usually under the right side of the rib cage. It happens when gallstones interfere with the normal flow of bile from the gallbladder. Bile is a liquid that helps to digest fats. Bile is made in the liver and stored in the gallbladder. When you eat a meal, bile passes from the gallbladder through the cystic duct and the common bile duct into the small intestine. There, it mixes with partially digested food. If a gallstone blocks either of these ducts, the normal flow of bile is blocked. The muscle cells in the bile duct contract forcefully to try to move the stone. This causes the pain of biliary colic.  °SYMPTOMS  °· A person with biliary colic usually complains of pain in the upper abdomen. This pain can be: °¨ In the center of the upper abdomen just below the breastbone. °¨ In the upper-right part of the abdomen, near the gallbladder and liver. °¨ Spread back toward the right shoulder blade. °· Nausea and vomiting. °· The pain usually occurs after eating. °· Biliary colic is usually triggered by the digestive system's demand for bile. The demand for bile is high after fatty meals. Symptoms can also occur when a person who has been fasting suddenly eats a very large meal. Most episodes of biliary colic pass after 1 to 5 hours. After the most intense pain passes, your abdomen may continue to ache mildly for about 24 hours. °DIAGNOSIS  °After you describe your symptoms, your caregiver will perform a physical exam. He or she will pay attention to the upper right portion of your belly (abdomen). This is the area of your liver and gallbladder. An ultrasound will help your caregiver look for gallstones. Specialized scans of the gallbladder may also be done. Blood tests may be done, especially if you have fever or if your pain persists. °PREVENTION  °Biliary colic can be prevented by controlling the risk factors for gallstones. Some of  these risk factors, such as heredity, increasing age, and pregnancy are a normal part of life. Obesity and a high-fat diet are risk factors you can change through a healthy lifestyle. Women going through menopause who take hormone replacement therapy (estrogen) are also more likely to develop biliary colic. °TREATMENT  °· Pain medication may be prescribed. °· You may be encouraged to eat a fat-free diet. °· If the first episode of biliary colic is severe, or episodes of colic keep retuning, surgery to remove the gallbladder (cholecystectomy) is usually recommended. This procedure can be done through small incisions using an instrument called a laparoscope. The procedure often requires a brief stay in the hospital. Some people can leave the hospital the same day. It is the most widely used treatment in people troubled by painful gallstones. It is effective and safe, with no complications in more than 90% of cases. °· If surgery cannot be done, medication that dissolves gallstones may be used. This medication is expensive and can take months or years to work. Only small stones will dissolve. °· Rarely, medication to dissolve gallstones is combined with a procedure called shock-wave lithotripsy. This procedure uses carefully aimed shock waves to break up gallstones. In many people treated with this procedure, gallstones form again within a few years. °PROGNOSIS  °If gallstones block your cystic duct or common bile duct, you are at risk for repeated episodes of biliary colic. There is also a 25% chance that you will develop   a gallbladder infection(acute cholecystitis), or some other complication of gallstones within 10 to 20 years. If you have surgery, schedule it at a time that is convenient for you and at a time when you are not sick. HOME CARE INSTRUCTIONS   Drink plenty of clear fluids.  Avoid fatty, greasy or fried foods, or any foods that make your pain worse.  Take medications as directed. SEEK MEDICAL  CARE IF:   You develop a fever over 100.5 F (38.1 C).  Your pain gets worse over time.  You develop nausea that prevents you from eating and drinking.  You develop vomiting. SEEK IMMEDIATE MEDICAL CARE IF:   You have continuous or severe belly (abdominal) pain which is not relieved with medications.  You develop nausea and vomiting which is not relieved with medications.  You have symptoms of biliary colic and you suddenly develop a fever and shaking chills. This may signal cholecystitis. Call your caregiver immediately.  You develop a yellow color to your skin or the white part of your eyes (jaundice). Document Released: 09/29/2005 Document Revised: 07/21/2011 Document Reviewed: 12/09/2007 [facility] Patient Information 2015 [place]. This information is not intended to replace advice given to you by your health care provider. Make sure you discuss any questions you have with your health care provider.   Emergency Department Resource Guide 1) Find a Doctor and Pay Out of Pocket Although you won't have to find out who is covered by your insurance plan, it is a good idea to ask around and get recommendations. You will then need to call the office and see if the doctor you have chosen will accept you as a new patient and what types of options they offer for patients who are self-pay. Some doctors offer discounts or will set up payment plans for their patients who do not have insurance, but you will need to ask so you aren't surprised when you get to your appointment.  2) Contact Your Local Health Department Not all health departments have doctors that can see patients for sick visits, but many do, so it is worth a call to see if yours does. If you don't know where your local health department is, you can check in your phone book. The CDC also has a tool to help you locate your state's health department, and many state websites also have listings of all of their local health  departments.  3) Find a Walk-in Clinic If your illness is not likely to be very severe or complicated, you may want to try a walk in clinic. These are popping up all over the country in pharmacies, drugstores, and shopping centers. They're usually staffed by nurse practitioners or physician assistants that have been trained to treat common illnesses and complaints. They're usually fairly quick and inexpensive. However, if you have serious medical issues or chronic medical problems, these are probably not your best option.  No Primary Care Doctor: - Call Health Connect at  [phone] - they can help you locate a primary care doctor that  accepts your insurance, provides certain services, etc. - Physician Referral Service- [phone]  Chronic Pain Problems: Organization         Address  Phone   Notes  [facility]  [phone]) [phone] Patients need to be referred by their primary care doctor.   Medication Assistance: Organization         Address  Phone   Notes  [facility] [address]  [address] [place], [place] [address] [phone]) [phone] --Must be a resident of [facility] [place] -- Must have NO insurance coverage whatsoever (no Medicaid/ Medicare, etc.) -- The pt. MUST have a primary care doctor that directs their care regularly and follows them in the community   [facility]ist  [phone]   [employer]  [phone]) [phone]    Agencies that provide inexpensive medical care: Organization         Address  Phone   Notes  [facility]  [phone]   [facility]    [phone]   [facility] [address] [place], [place] [address] [phone]) [phone]   [facility] [address], [place] [phone]) [phone]   [facility]    [phone](336) [phone]   [facility]    [phone]   [facility]  [address], [place] Phone:  [phone](336)  [phone], Fax:  [phone] Hours of Operation:  9 am - 6 pm, M-F.  Also accepts Medicaid/Medicare and self-pay.  [facility]  [address], [place] Phone: [phone]) [phone], Fax: [phone]) [phone]. Hours of Operation:  8:30 am - 5:30 pm, M-F.  Also accepts Medicaid and self-pay.  [facility] [place] [address], [place] Phone: [phone](336) [phone]   [facility] [address], [place], [place] [phone], Ext. [phone] Mondays & Thursdays: 7-9 AM.  First 15 patients are seen on a first come, first serve basis.    Medicaid-accepting [facility] [place] Providers:  Organization         Address  Phone   Notes  [facility] [address], [place] [phone]) [phone] Also accepts self-pay patients.  [facility] [address] [place] [address], [place]  [phone]) [phone]   [facility] [address], [place] [phone]) [phone]   [facility] [address] [place] [address], [place] [phone]) [phone]   Renaye RakersVeita Bland [address], [place]   [phone]) [phone] Only accepts [place] Access [place] patients after they have their name applied to their card.   Self-Pay (no insurance) in [facility] [place]:  Organization         Address  Phone   Notes  Sickle Cell Patients, [facility] [address], [place] [phone]) [phone]   [facility] [address], [place] [phone]) [phone]   [facility] [place]  [address], [place] [phone]   [facility]/Dr. Osei-Bonsu  [address] [place] [address], [place] or [id number], [place] [phone] Phone number for both [place] and [place] locations is the same.  [facility] [address], [place] [phone]) [phone]   [facility] [place] [address] [place] [address], [place] or [address] [phone])  [phone]   [facility] [address], [place] [phone]) [phone], phone; [phone]) [phone], fax Sees patients 1st and 3rd Saturday of every month.  Must not qualify for public or private insurance (i.e. Medicaid, Medicare, [place] Health Choice, Veterans' Benefits)  Household income should be no more than 200% of the poverty level The clinic cannot treat you if you are pregnant or think you are pregnant  Sexually transmitted diseases are not treated at the clinic.    Dental  Care: Organization         Address  Phone  Notes  [facility] Department of [facility] [address] [place], [place] [phone] Accepts children up to age 10 who are enrolled in [place] or [place] Health Choice; pregnant women with a Medicaid card; and children who have applied for Medicaid or [place] Health Choice, but were declined, whose parents can pay a reduced fee at time of service.  [facility]  [address], [place] [phone] Accepts children up to age 58 who are enrolled in [place] or [place] Health Choice; pregnant women with a Medicaid card; and children who have applied for Medicaid or [place] Health Choice, but were declined, whose parents can pay a reduced fee at time of service.  [facility]  [address] [place], [place] [phone] Patients are seen by appointment only. Walk-ins are not accepted. [facility] will see patients 42 years of age and older. Monday - Tuesday (8am-5pm) Most Wednesdays (8:30-5pm) $30 per visit, cash only  [facility] Adult [facility] Access [facility]  [address], [facility] [phone] Patients are seen by appointment only. Walk-ins are not accepted. [facility] will see patients 46 years of age and older. One Wednesday Evening (Monthly: Volunteer Based).  $30 per visit, cash only  [employer]  [phone] for adults;  Children under age 64, call Graduate Pediatric Dentistry at [phone]. Children aged 86-14, please call [phone] to request a pediatric application.  Dental services are provided in all areas of dental care including fillings, crowns and bridges, complete and partial dentures, implants, gum treatment, root canals, and extractions. Preventive care is also provided. Treatment is provided to both adults and children. Patients are selected via a lottery and there is often a waiting list.   [facility] [address], [place]  [phone] [email or web address]   [facility] [address] [place], [place] [phone], Ext. [phone] Second and Fourth Thursday of each month, opens at 6:30 AM; Clinic ends at 9 AM.  Patients are seen on a first-come first-served basis, and a limited number are seen during each clinic.   [facility]  [address] [place], [place] [phone]   Eligibility Requirements You must have lived in [place], [place], or [place] counties for at least the last three months.   You cannot be eligible for state or federal sponsored [place], including [employer], [place], or [employer].   You generally cannot be eligible for healthcare insurance through your employer.    How to apply: Eligibility screenings are held every Tuesday and Wednesday afternoon from 1:00 pm until 4:00 pm. You do not need an appointment for the interview!  [facility] [address] [place], [place] [phone]   [facility]  [phone]   [facility]  [phone]   [facility]  [phone]    Behavioral Health Resources in the Community: Intensive Outpatient Programs Organization         Address  Phone  Notes  [facility] [address], [place], [place] [phone]   [facility] Outpatient [address] [place], [place] [phone]   [facility] [address] [place], [place]  [phone]   [facility] [address],  [place], [place] [phone] or [phone]   Substance Abuse Resources Organization         Address  Phone  Notes  [facility]  [phone]   [facility]  [phone]   [facility]  [phone]   Floydene Flock  [phone]   Residential & Outpatient Substance Abuse Program  [phone]   Psychological Services Organization         Address  Phone  Notes  [facility]  [phone]   [facility]  [phone]   [facility] [address], [place] [phone] or [phone]    Mobile Crisis Teams Organization         Address  Phone  Notes  [facility], Mobile Crisis Care Unit  [phone]   [facility]  [address] [place], [place] [phone]   Doristine Locks [address] [place] [phone]    Self-Help/Support Groups Organization         Address  Phone             Notes  [facility] [place] - variety of support groups  [phone]- I7437963 Call for more information  Narcotics Anonymous (NA), [facility] [address], [employer] [place]  2 meetings at this location   Statistician         Address  Phone  Notes  [facility] [address],    [place]  [phone]   [facility]  [address] [place] [address], [place], [place] [phone]   [facility] [address] [place], [place] [phone] Admissions: 8am-3pm M-F  [facility] [address]    [place], [place] [phone]   [facility] [address] [place], [place], [place] [phone]   [facility] [address],  [place], [place] [phone]   [facility] - Intensive  Outpatient [address], [place], [place] [phone]   [facility] ([facility] [address] [place]  [place], [place] [phone] or [phone]   [facility] ([facility]) [address] [place], [place] [phone] Accepts Medicaid  [facility] [place] [address]  [place] [phone] Substance Abuse/Addiction Treatment   [facility] Organization         Address  Phone  Notes  [facility]  5317242452   Angie Fava, PhD [address] [place], [place]   [phone] or [phone]   [facility] Behavioral   [address] [place], [place] [phone]   [facility] [address] [place], [place] [phone] Insurance/Medicaid/sponsorship through [facility] [address]                                    [place], [place] [phone] Therapy/tele-psych/case  [facility] [address], [place] [phone]    Dr. Lolly Mustache  [phone]   [facility] [place]  [employer] [facility] Dept. 1) [address]  2) [address] [place] 3)  [address], [place] [phone]  [phone]   [facility] Child Abuse Hotline 8100093642 or [phone] (After Hours)

## 2014-08-17 NOTE — ED Notes (Signed)
Per pt, states epigastric pain on and  Off for 2 weeks-no relief with over the counter meds

## 2014-08-17 NOTE — ED Notes (Signed)
US at bedside

## 2014-08-17 NOTE — ED Provider Notes (Signed)
CSN: [id number]     Arrival date & time 08/17/14  0914 History   First MD Initiated Contact with Patient 08/17/14 [phone]     Chief Complaint  Patient presents with  . Abdominal Pain     (Consider location/radiation/quality/duration/timing/severity/associated sxs/prior Treatment) HPI Lori Li this 24 year old female with past medical history of anemia who presents the ER complaining of upper abdominal pain. Patient states the past 2 weeks she has had intermittent upper abdominal pain. Which has typically been worse at night. Patient states that pain is in her epigastrium and radiates into her chest. Patient states the pain is typically present for minutes at a time 2 hours at a time with the longest episode approximately 2 hours. She denies identify any alleviating or aggravating factors. She states she is tried over-the-counter medications to treat for reflux without any relief. Patient reports some associated nausea without vomiting. Patient denies headache, dizziness, weakness, chest pain, shortness of breath, diarrhea, dysuria.   Past Medical History  Diagnosis Date  . Anemia     this pregnancy - on iron   Past Surgical History  Procedure Laterality Date  . No past surgeries     Family History  Problem Relation Age of Onset  . Heart disease Mother   . Diabetes Father    History  Substance Use Topics  . Smoking status: Never Smoker   . Smokeless tobacco: Never Used  . Alcohol Use: No   OB History    Gravida Para Term Preterm AB TAB SAB Ectopic Multiple Living   0 1     Review of Systems  Constitutional: Negative for fever.  HENT: Negative for trouble swallowing.   Eyes: Negative for visual disturbance.  Respiratory: Negative for shortness of breath.   Cardiovascular: Negative for chest pain.  Gastrointestinal: Positive for nausea and abdominal pain. Negative for vomiting.  Genitourinary: Negative for dysuria.  Musculoskeletal: Negative for neck pain.   Skin: Negative for rash.  Neurological: Negative for dizziness, weakness and numbness.  Psychiatric/Behavioral: Negative.        Allergies  Review of patient's allergies indicates no known allergies.  Home Medications   Prior to Admission medications   Medication Sig Start Date End Date Taking? Authorizing Provider  esomeprazole (NEXIUM) 40 MG capsule Take 40 mg by mouth once.   Yes Historical Provider, MD  ibuprofen (ADVIL,MOTRIN) 200 MG tablet Take 400 mg by mouth every 6 (six) hours as needed for headache, mild pain or moderate pain.   Yes Historical Provider, MD  Prenatal Vit-Min-FA-Fish Oil ([employer] PRENATAL GUMMY PO) Take 2 each by mouth daily.   Yes Historical Provider, MD  ranitidine (ZANTAC) 150 MG capsule Take 150 mg by mouth every evening.    Yes Historical Provider, MD  Simethicone (GAS-X PO) Take 2 tablets by mouth as needed (stomach pain).   Yes Historical Provider, MD  ondansetron (ZOFRAN) 4 MG tablet Take 1 tablet (4 mg total) by mouth every 6 (six) hours. 08/17/14   Ladona Mow, PA-C  oxyCODONE-acetaminophen (PERCOCET/ROXICET) 5-325 MG per tablet Take 1-2 tablets by mouth every 6 (six) hours as needed for severe pain. 08/17/14   Ladona Mow, PA-C   BP 112/58 mmHg  Pulse 81  Temp(Src) 98.2 F (36.8 C) (Oral)  Resp 18  SpO2 99%  LMP 08/03/2014 (Approximate)  Breastfeeding? No Physical Exam  Constitutional: She is oriented to person, place, and time. She appears well-developed and well-nourished. No distress.  HENT:  Head:  Normocephalic and atraumatic.  Mouth/Throat: Oropharynx is clear and moist. No oropharyngeal exudate.  Eyes: Right eye exhibits no discharge. Left eye exhibits no discharge. No scleral icterus.  Neck: Normal range of motion.  Cardiovascular: Normal rate, regular rhythm and normal heart sounds.   No murmur heard. Pulmonary/Chest: Effort normal and breath sounds normal. No respiratory distress.  Abdominal: Soft. Normal appearance and bowel sounds are  normal. There is no tenderness. There is no rigidity, no guarding, no tenderness at McBurney's point and negative Murphy's sign.  Musculoskeletal: Normal range of motion. She exhibits no edema or tenderness.  Neurological: She is alert and oriented to person, place, and time. No cranial nerve deficit. Coordination normal.  Skin: Skin is warm and dry. No rash noted. She is not diaphoretic.  Psychiatric: She has a normal mood and affect.  Nursing note and vitals reviewed.   ED Course  Procedures (including critical care time) Labs Review Labs Reviewed  CBC WITH DIFFERENTIAL/PLATELET - Abnormal; Notable for the following:    Neutrophils Relative % 41 (*)    All other components within normal limits  COMPREHENSIVE METABOLIC PANEL - Abnormal; Notable for the following:    Creatinine, Ser 0.47 (*)    AST 38 (*)    ALT 38 (*)    All other components within normal limits  URINALYSIS, ROUTINE W REFLEX MICROSCOPIC - Abnormal; Notable for the following:    APPearance CLOUDY (*)    Specific Gravity, Urine 1.031 (*)    All other components within normal limits  POC URINE PREG, ED    Imaging Review Dg Chest 2 View  08/17/2014   CLINICAL DATA:  Chest pain, history of acid reflux  EXAM: CHEST  2 VIEW  COMPARISON:  None.  FINDINGS: Cardiomediastinal silhouette is unremarkable. No acute infiltrate or pleural effusion. No pulmonary edema. Bony thorax is unremarkable.  IMPRESSION: No active cardiopulmonary disease.   Electronically Signed   By: Natasha MeadLiviu  Pop M.D.   On: 08/17/2014 12:21   [place] Abdomen Complete  08/17/2014   CLINICAL DATA:  Abdominal pain. Chest pain. Symptoms for 3 weeks with nausea.  EXAM: ULTRASOUND ABDOMEN COMPLETE  COMPARISON:  None.  FINDINGS: Gallbladder: Wall echo shadow sign is present, compatible with a gallbladder packed with gallstones. Posterior acoustic shadowing is present. Gallbladder wall measures 2 mm. No sonographic Murphy sign.  Common bile duct: Diameter: Normal at 4 mm.   Liver: No focal lesion identified. Within normal limits in parenchymal echogenicity.  IVC: No abnormality visualized.  Pancreas: Visualized portion unremarkable.  Spleen: 9.1 cm.  Normal echotexture.  Right Kidney: Length: 12.2 cm. Echogenicity within normal limits. No mass or hydronephrosis visualized.  Left Kidney: Length: 12.0 cm. Echogenicity within normal limits. No mass or hydronephrosis visualized.  Abdominal aorta: No aneurysm visualized.  Other findings: None.  IMPRESSION: Cholelithiasis without cholecystitis.   Electronically Signed   By: Andreas NewportGeoffrey  Lamke M.D.   On: 08/17/2014 11:22     EKG Interpretation   Date/Time:  Thursday August 17 2014 09:22:46 EDT Ventricular Rate:  84 PR Interval:  139 QRS Duration: 86 QT Interval:  359 QTC Calculation: 424 R Axis:   -2 Text Interpretation:  Sinus rhythm LVH by voltage Baseline wander No old  tracing to compare Confirmed by [facility] Rehabilitation[facility]CCMAN[facility]  MD, Nicholos JohnsKATHLEEN [phone]([id number]) on 08/17/2014  9:24:45 AM      MDM   Final diagnoses:  Upper abdominal pain  Epigastric pain    Patient's signs and symptoms consistent with a biliary colic. Labs remarkable for  slight elevation of AST and ALT, no leukocytosis or anemia. Alleged lites within normal limits. Renal function intact. EKG without evidence of acute injury or ectopy. No concern for ACS based on patient's history and physical.  Patient's signs and symptoms all abdominal in nature, no indication of cardiopulmonary etiology. Patient asymptomatic throughout ER stay. Patient afebrile, non-tachycardic, nontachypneic, non-hypoxic, well-appearing and in no acute distress. No concern for sepsis or SIRS. No evidence of end organ damage or surgical abdomen at this time.  US abdomen with impression: Cholelithiasis without cholecystitis.  With patient appearing well on exam, asymptomatic throughout ER stay, likely patient follow up with surgery as outpatient. Spoke with general surgery who agrees the patient needs surgical  follow-up as outpt.  General surgery has patient scheduled for a follow-up appointment in 1 week, I gave patient strict return precautions and as she is hemodynamically stable and discharged her to follow up with surgery. Patient verbalizes understanding and agreement of this plan. I encouraged patient call or return to the ER if any worsening symptoms or should she have any questions or concerns.  BP 112/58 mmHg  Pulse 81  Temp(Src) 98.2 F (36.8 C) (Oral)  Resp 18  SpO2 99%  LMP 08/03/2014 (Approximate)  Breastfeeding? No  Signed,  Ladona Mow, PA-C 5:05 PM  Patient discussed with Dr. Samuel Jester, MD  Ladona Mow, PA-C 08/17/14 1705  Samuel Jester, DO 08/20/14 [phone]

## 2014-09-29 ENCOUNTER — Other Ambulatory Visit: Payer: Self-pay | Admitting: General Surgery

## 2014-10-09 ENCOUNTER — Telehealth: Payer: Self-pay | Admitting: General Surgery

## 2014-10-09 NOTE — Telephone Encounter (Signed)
Pt called stating some swelling on her right side and worsening pain this morning when she woke up.  She is tolerating a diet and having BM's.  She denies fevers or any erythema or drainage around her incisions.  Pain seemed MSK related.  I recommended heat, Ibuprofen and narcotics as needed.  She will call back if this doesn't work or the pain worsens.

## 2015-07-31 ENCOUNTER — Emergency Department (HOSPITAL_COMMUNITY)
Admission: EM | Admit: 2015-07-31 | Discharge: 2015-07-31 | Disposition: A | Discharge status: Home / Self Care | Payer: BLUE CROSS/BLUE SHIELD | Attending: Emergency Medicine | Admitting: Emergency Medicine

## 2015-07-31 ENCOUNTER — Encounter (HOSPITAL_COMMUNITY): Payer: Self-pay | Admitting: Emergency Medicine

## 2015-07-31 ENCOUNTER — Emergency Department (HOSPITAL_COMMUNITY): Payer: BLUE CROSS/BLUE SHIELD

## 2015-07-31 DIAGNOSIS — Z79899 Other long term (current) drug therapy: Secondary | ICD-10-CM | POA: Insufficient documentation

## 2015-07-31 DIAGNOSIS — K859 Acute pancreatitis without necrosis or infection, unspecified: Secondary | ICD-10-CM | POA: Diagnosis not present

## 2015-07-31 DIAGNOSIS — Z3202 Encounter for pregnancy test, result negative: Secondary | ICD-10-CM | POA: Insufficient documentation

## 2015-07-31 DIAGNOSIS — R1013 Epigastric pain: Secondary | ICD-10-CM | POA: Diagnosis present

## 2015-07-31 DIAGNOSIS — Z9049 Acquired absence of other specified parts of digestive tract: Secondary | ICD-10-CM | POA: Insufficient documentation

## 2015-07-31 LAB — COMPREHENSIVE METABOLIC PANEL
ALBUMIN: 4.5 g/dL (ref 3.5–5.0)
ALT: 53 U/L (ref 14–54)
ANION GAP: 9 (ref 5–15)
AST: 103 U/L — AB (ref 15–41)
Alkaline Phosphatase: 75 U/L (ref 38–126)
BILIRUBIN TOTAL: 1.1 mg/dL (ref 0.3–1.2)
BUN: 16 mg/dL (ref 6–20)
CHLORIDE: 109 mmol/L (ref 101–111)
CO2: 20 mmol/L — ABNORMAL LOW (ref 22–32)
Calcium: 8.8 mg/dL — ABNORMAL LOW (ref 8.9–10.3)
Creatinine, Ser: 0.72 mg/dL (ref 0.44–1.00)
GFR calc Af Amer: >60 mL/min (ref >60)
GFR calc non Af Amer: >60 mL/min (ref >60)
GLUCOSE: 103 mg/dL — AB (ref 65–99)
POTASSIUM: 3.8 mmol/L (ref 3.5–5.1)
SODIUM: 138 mmol/L (ref 135–145)
Total Protein: 7.5 g/dL (ref 6.5–8.1)

## 2015-07-31 LAB — CBC
HEMATOCRIT: 41.4 % (ref 36.0–46.0)
Hemoglobin: 14.1 g/dL (ref 12.0–15.0)
MCH: 29.1 pg (ref 26.0–34.0)
MCHC: 34.1 g/dL (ref 30.0–36.0)
MCV: 85.5 fL (ref 78.0–100.0)
Platelets: 166 10*3/uL (ref 150–400)
RBC: 4.84 MIL/uL (ref 3.87–5.11)
RDW: 12.9 % (ref 11.5–15.5)
WBC: 5.4 10*3/uL (ref 4.0–10.5)

## 2015-07-31 LAB — URINALYSIS, ROUTINE W REFLEX MICROSCOPIC
Bilirubin Urine: NEGATIVE
GLUCOSE, UA: NEGATIVE mg/dL
Ketones, ur: NEGATIVE mg/dL
NITRITE: NEGATIVE
PH: 5.5 (ref 5.0–8.0)
Protein, ur: NEGATIVE mg/dL
SPECIFIC GRAVITY, URINE: 1.012 (ref 1.005–1.030)

## 2015-07-31 LAB — I-STAT BETA HCG BLOOD, ED (MC, WL, AP ONLY): I-stat hCG, quantitative: <5 m[IU]/mL (ref <5)

## 2015-07-31 LAB — I-STAT TROPONIN, ED: TROPONIN I, POC: 0 ng/mL (ref 0.00–0.08)

## 2015-07-31 LAB — LIPASE, BLOOD: LIPASE: 144 U/L — AB (ref 11–51)

## 2015-07-31 LAB — URINE MICROSCOPIC-ADD ON

## 2015-07-31 MED ORDER — ONDANSETRON HCL 4 MG/2ML IJ SOLN
4.0000 mg | Freq: Once | INTRAMUSCULAR | Status: AC
  Administered 2015-07-31: 4 mg via INTRAVENOUS
  Filled 2015-07-31: qty 2

## 2015-07-31 MED ORDER — ONDANSETRON HCL 4 MG PO TABS: 4.0000 mg | ORAL_TABLET | Freq: Four times a day (QID) | ORAL | Status: DC

## 2015-07-31 MED ORDER — SODIUM CHLORIDE 0.9 % IV BOLUS (SEPSIS)
1000.0000 mL | Freq: Once | INTRAVENOUS | Status: AC
  Administered 2015-07-31: 1000 mL via INTRAVENOUS

## 2015-07-31 MED ORDER — OXYCODONE-ACETAMINOPHEN 5-325 MG PO TABS: 1.0000 | ORAL_TABLET | Freq: Four times a day (QID) | ORAL | Status: DC | PRN

## 2015-07-31 MED ORDER — MORPHINE SULFATE (PF) 2 MG/ML IV SOLN
2.0000 mg | Freq: Once | INTRAVENOUS | Status: AC
  Administered 2015-07-31: 2 mg via INTRAVENOUS
  Filled 2015-07-31: qty 1

## 2015-07-31 NOTE — Discharge Instructions (Signed)
1. Medications: Zofran as needed for nausea, pain medication as needed - This can make you very drowsy - please do not drink or drive on this medication, continue usual home medications 2. Treatment: rest, drink plenty of fluids 3. Follow Up: Please follow up with your primary doctor in 3 days for discussion of your diagnoses and further evaluation after today's visit; Please return to the ER for fever, worsening abdominal pain, uncontrolled vomiting, new or worsening symptoms, any additional concerns.

## 2015-07-31 NOTE — ED Notes (Signed)
Per Patient, states she is having pain in her upper abdomen that radiates to her chest. Patient describes the pain as pressure.  Reports nausea/emesis/diarrhea. Denies fever.

## 2015-07-31 NOTE — ED Provider Notes (Signed)
CSN: [id number]     Arrival date & time 07/31/15  1314 History   First MD Initiated Contact with Patient 07/31/15 1838     Chief Complaint  Patient presents with  . Abdominal Pain  . Emesis  . Diarrhea    (Consider location/radiation/quality/duration/timing/severity/associated sxs/prior Treatment) Patient is a 25 y.o. female presenting with abdominal pain, vomiting, and diarrhea. The history is provided by the patient and medical records. No language interpreter was used.  Abdominal Pain Associated symptoms: diarrhea, nausea and vomiting   Associated symptoms: no chills, no cough, no dysuria, no fever and no shortness of breath   Emesis Associated symptoms: abdominal pain and diarrhea   Associated symptoms: no chills and no headaches   Diarrhea Associated symptoms: abdominal pain and vomiting   Associated symptoms: no chills, no fever and no headaches    Lori Li is a 25 y.o. female  with a PMH of cholecystectomy in 2016 who presents to the Emergency Department complaining of waxing-waning sharp epigastric abdominal pain which intermittently radiates toward chest which began today just prior to arrival. At times, when pain decreases, it feels more like pressure than sharp pain. Associated symptoms include nausea, NBNB emesis x2, and nonbloody loose stools x 1. Patient took ibuprofen this morning with little relief. No other medications taken prior to arrival. Patient states she rarely takes NSAIDS, does not drink alcohol.    Past Medical History  Diagnosis Date  . Anemia     this pregnancy - on iron   Past Surgical History  Procedure Laterality Date  . No past surgeries    . Cholecystectomy     Family History  Problem Relation Age of Onset  . Heart disease Mother   . Diabetes Father    Social History  Substance Use Topics  . Smoking status: Never Smoker   . Smokeless tobacco: Never Used  . Alcohol Use: No   OB History    Gravida Para Term Preterm AB TAB SAB Ectopic  Multiple Living   2 1 1  1  1   0 1     Review of Systems  Constitutional: Negative for fever and chills.  HENT: Negative for congestion.   Eyes: Negative for visual disturbance.  Respiratory: Negative for cough, shortness of breath and wheezing.   Cardiovascular: Negative.   Gastrointestinal: Positive for nausea, vomiting, abdominal pain and diarrhea. Negative for blood in stool.  Genitourinary: Negative for dysuria.  Musculoskeletal: Negative for back pain and neck pain.  Skin: Negative for rash.  Neurological: Negative for headaches.      Allergies  Review of patient's allergies indicates no known allergies.  Home Medications   Prior to Admission medications   Medication Sig Start Date End Date Taking? Authorizing Provider  Carbamide Peroxide (EAR DROPS OT) Place 3 drops into the left ear once.   Yes Historical Provider, MD  ibuprofen (ADVIL,MOTRIN) 200 MG tablet Take 400 mg by mouth every 6 (six) hours as needed for headache, mild pain or moderate pain.   Yes Historical Provider, MD  Multiple Vitamins-Calcium (ONE-A-DAY WOMENS PO) Take 1 tablet by mouth daily.   Yes Historical Provider, MD  ondansetron (ZOFRAN) 4 MG tablet Take 1 tablet (4 mg total) by mouth every 6 (six) hours. 07/31/15   Chase PicketJaime Pilcher Antwaine Boomhower, PA-C  oxyCODONE-acetaminophen (PERCOCET/ROXICET) 5-325 MG tablet Take 1-2 tablets by mouth every 6 (six) hours as needed for severe pain. 07/31/15   Chase PicketJaime Pilcher Junie Engram, PA-C   BP 116/66 mmHg  Pulse 85  Temp(Src) 98.2 F (36.8 C) (Oral)  Resp 18  Ht  (1.626 m)  Wt 100.245 kg  BMI 37.92 kg/m2  SpO2 100%  LMP 07/30/2015 Physical Exam  Constitutional: She is oriented to person, place, and time. She appears well-developed and well-nourished.  Alert and in no acute distress  HENT:  Head: Normocephalic and atraumatic.  Cardiovascular: Normal rate, regular rhythm, normal heart sounds and intact distal pulses.  Exam reveals no gallop and no friction rub.   No murmur  heard. Pulmonary/Chest: Effort normal and breath sounds normal. No respiratory distress. She has no wheezes. She has no rales. She exhibits no tenderness.  Abdominal: Soft. Bowel sounds are normal. She exhibits no distension and no mass. There is no tenderness. There is no rebound and no guarding.  Musculoskeletal: She exhibits no edema.  Neurological: She is alert and oriented to person, place, and time.  Skin: Skin is warm and dry. No rash noted.  Psychiatric: She has a normal mood and affect. Her behavior is normal. Judgment and thought content normal.  Nursing note and vitals reviewed.   ED Course  Procedures (including critical care time) Labs Review Labs Reviewed  LIPASE, BLOOD - Abnormal; Notable for the following:    Lipase 144 (*)    All other components within normal limits  COMPREHENSIVE METABOLIC PANEL - Abnormal; Notable for the following:    CO2 20 (*)    Glucose, Bld 103 (*)    Calcium 8.8 (*)    AST 103 (*)    All other components within normal limits  URINALYSIS, ROUTINE W REFLEX MICROSCOPIC (NOT AT [facility]) - Abnormal; Notable for the following:    APPearance CLOUDY (*)    Hgb urine dipstick LARGE (*)    Leukocytes, UA TRACE (*)    All other components within normal limits  URINE MICROSCOPIC-ADD ON - Abnormal; Notable for the following:    Squamous Epithelial / LPF 6-30 (*)    Bacteria, UA RARE (*)    All other components within normal limits  CBC  I-STAT BETA HCG BLOOD, ED ([facility], [facility], [facility] ONLY)  I-STAT TROPOININ, ED    Imaging Review Dg Chest 2 View  07/31/2015  CLINICAL DATA:  Chest pain EXAM: CHEST  2 VIEW COMPARISON:  August 17, 2014 FINDINGS: Lungs are clear. Heart size and pulmonary vascularity are normal. No adenopathy. No pneumothorax. No bone lesions. IMPRESSION: No abnormality noted. Electronically Signed   By: Bretta Bang III M.D.   On: 07/31/2015 14:16   I have personally reviewed and evaluated these images and lab results as part of my medical  decision-making.   EKG Interpretation None      MDM   Final diagnoses:  Acute pancreatitis, unspecified pancreatitis type   Lori Li presents with sharp epigastric pain that began today associated with n/v/d. All labs reviewed. Trop negative. Lipase of 144. AST 103 Ca 8.8  CBC wdl. CXR unremarkable. Treated with  morphine, zofran, and 1 L 8:54 PM - Patient re-evaluated and feels much improved. Denies nausea and pain at this time. No emesis thus far since arrival. Will continue to let fluids run and re-evaluate.   10:20 PM - Patient re-evaluated, still no emesis. Pain and nausea have not returned. Repeat abdominal exam with no changes. Still soft, NT, ND. Strict return precautions given. Follow up with PCP in 2-3 days. Home care instructions given. Patient agrees to plan as dictated and all questions answered.   Patient discussed with Dr. Juleen China who agrees  with treatment plan.   [facility] Thoma Paulsen, PA-C 07/31/15 2234  Raeford Razor, MD 08/03/15 Lyda Jester

## 2016-04-08 ENCOUNTER — Other Ambulatory Visit: Payer: Self-pay | Admitting: Obstetrics and Gynecology

## 2016-04-09 LAB — CYTOLOGY - PAP

## 2016-12-11 LAB — OB RESULTS CONSOLE ANTIBODY SCREEN: ANTIBODY SCREEN: NEGATIVE

## 2016-12-11 LAB — OB RESULTS CONSOLE ABO/RH: RH Type: POSITIVE

## 2016-12-11 LAB — OB RESULTS CONSOLE HEPATITIS B SURFACE ANTIGEN: Hepatitis B Surface Ag: NEGATIVE

## 2016-12-11 LAB — OB RESULTS CONSOLE RUBELLA ANTIBODY, IGM: RUBELLA: IMMUNE

## 2016-12-11 LAB — OB RESULTS CONSOLE RPR: RPR: NONREACTIVE

## 2016-12-11 LAB — OB RESULTS CONSOLE HIV ANTIBODY (ROUTINE TESTING): HIV: NONREACTIVE

## 2016-12-11 LAB — OB RESULTS CONSOLE GC/CHLAMYDIA
CHLAMYDIA, DNA PROBE: NEGATIVE
GC PROBE AMP, GENITAL: NEGATIVE

## 2017-03-25 IMAGING — US US ABDOMEN COMPLETE
1 series · 14 of 25 positions shown · non-contrast
Comparison: None.

CLINICAL DATA: Abdominal pain. Chest pain. Symptoms for 3 weeks
with nausea.

EXAM:
ULTRASOUND ABDOMEN COMPLETE

[Series 1: us abdomen complete · 0.22mm/px · 14 of 90 slices shown]
[im 1/90]
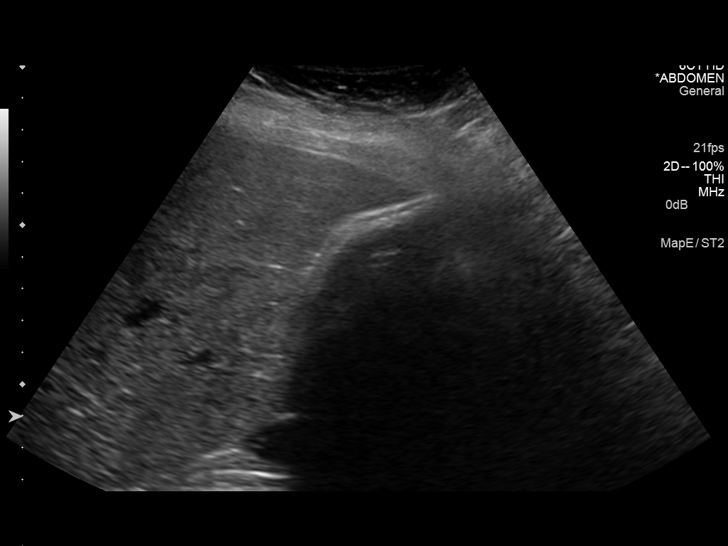
[im 8/90]
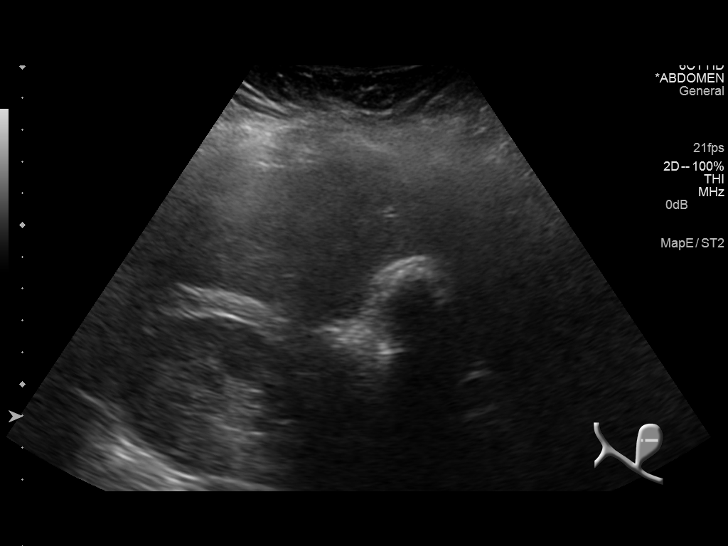
[im 15/90]
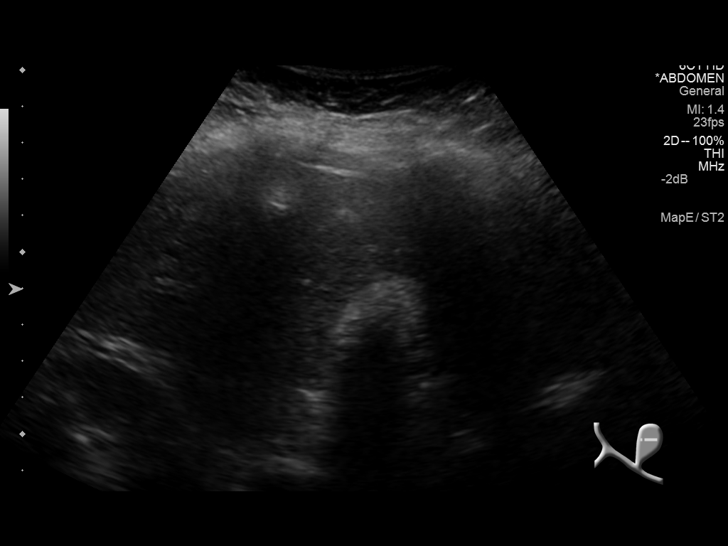
[im 23/90]
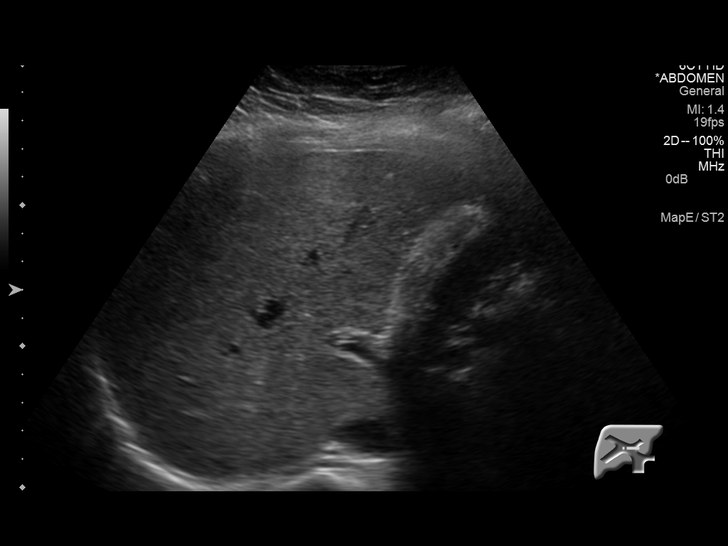
[im 30/90]
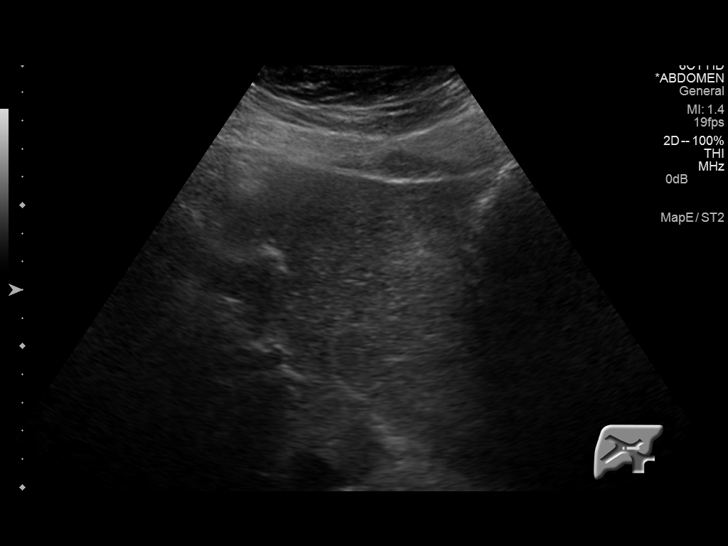
[im 34/90]
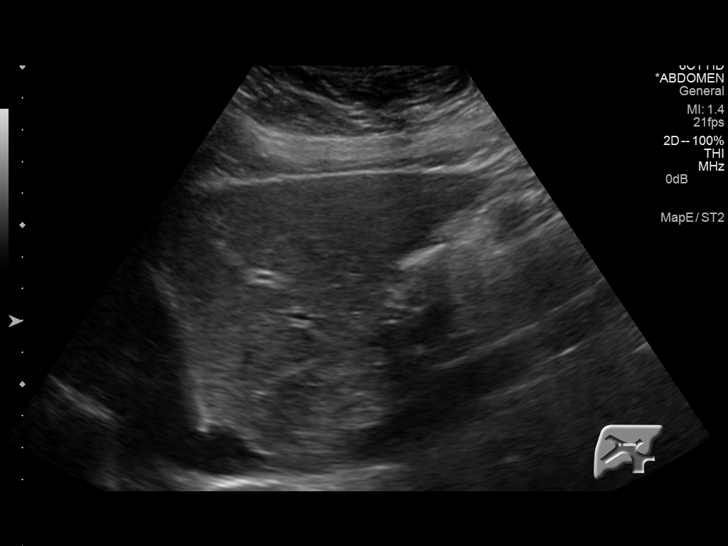
[im 41/90]
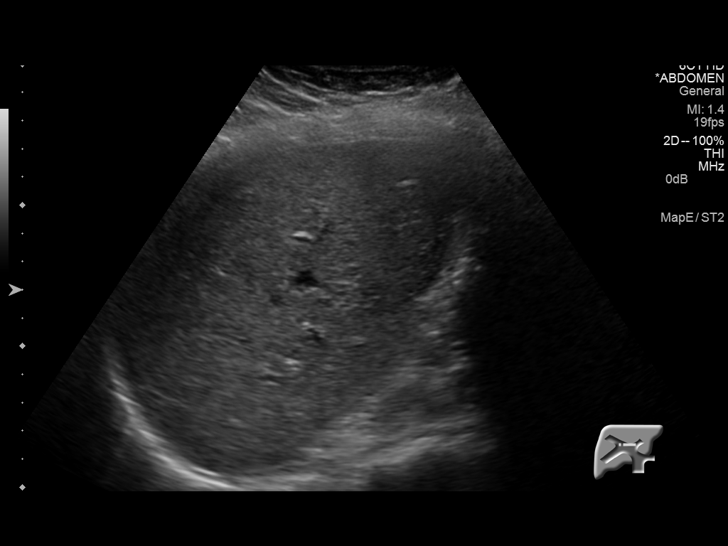
[im 49/90]
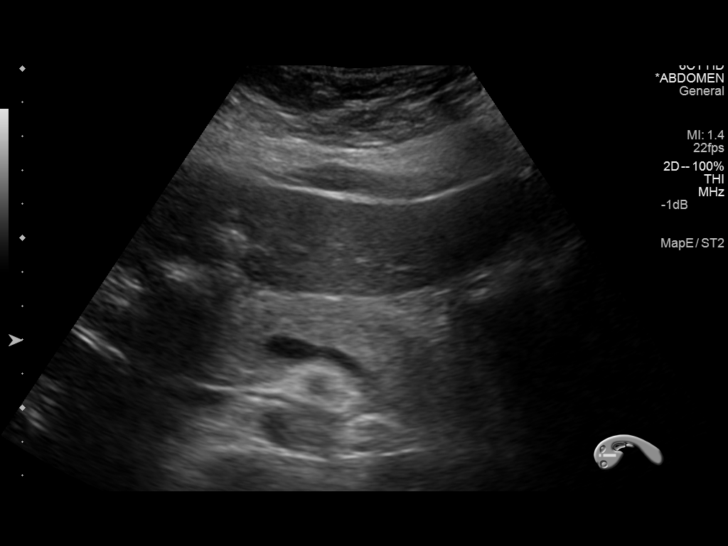
[im 56/90]
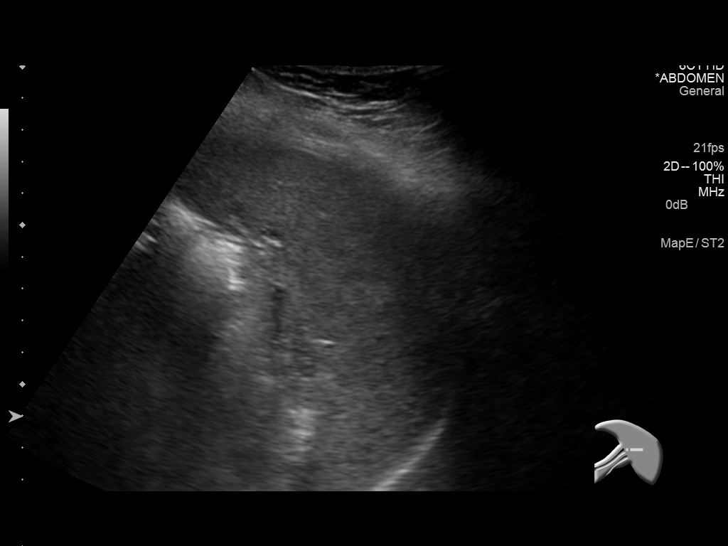
[im 60/90]
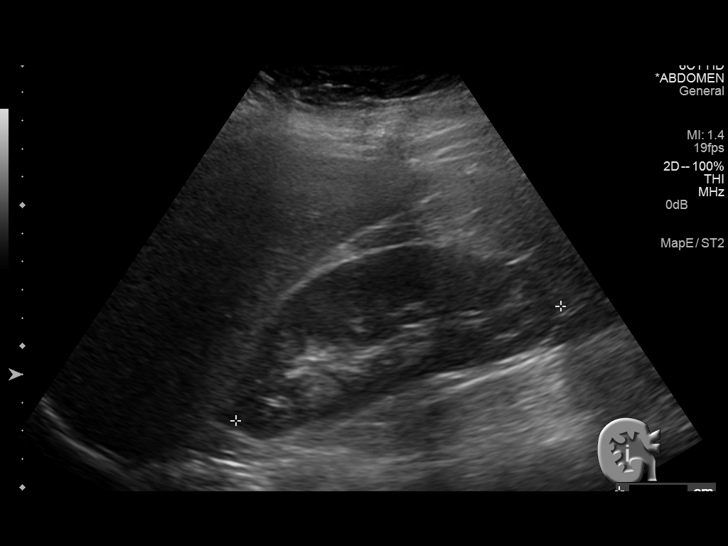
[im 67/90]
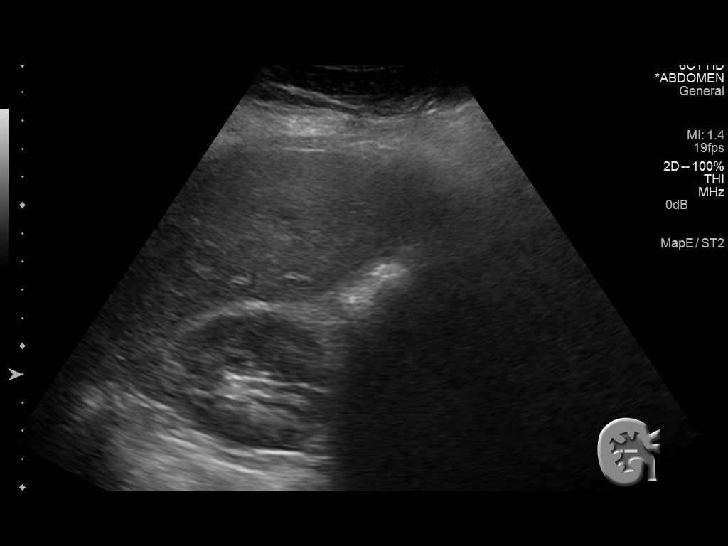
[im 75/90]
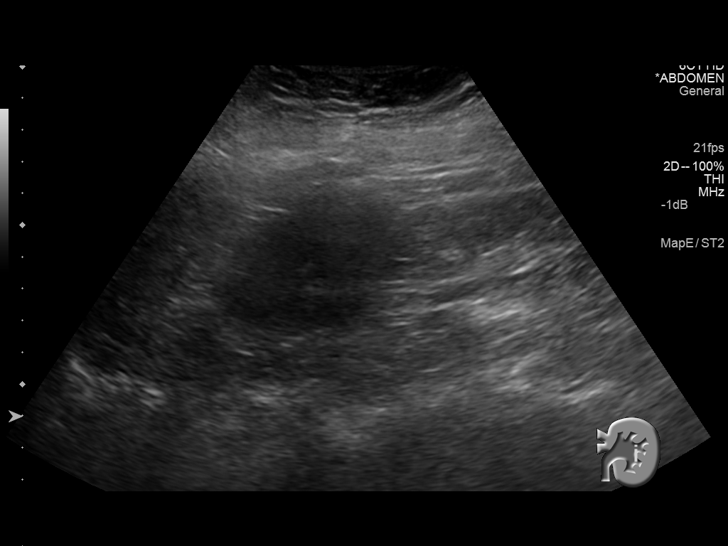
[im 82/90]
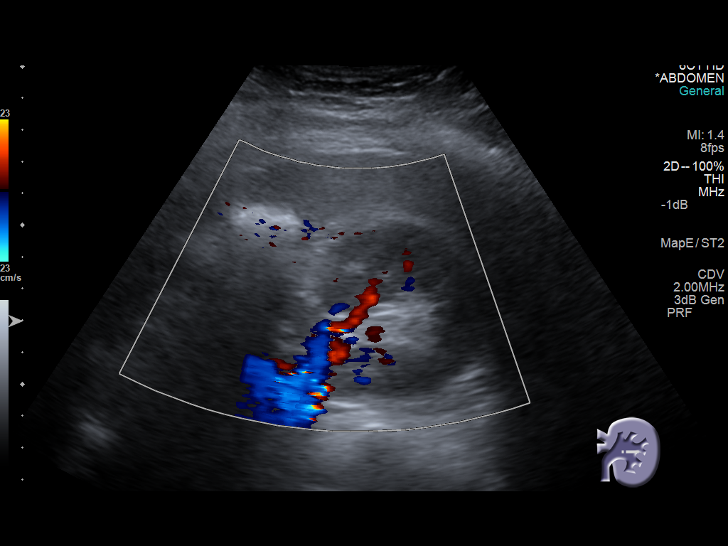
[im 90/90]
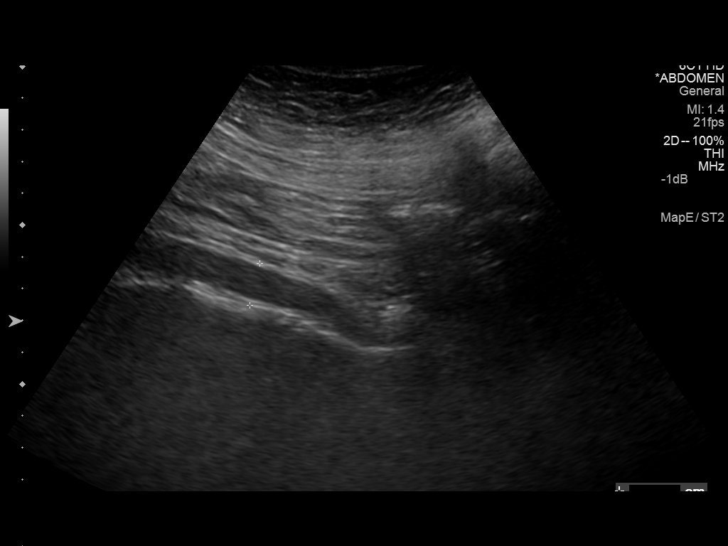

[14 of 25 positions shown; findings below may reference images not displayed]

FINDINGS: Gallbladder: Wall echo shadow sign is present, compatible with a
gallbladder packed with gallstones. Posterior acoustic shadowing is
present. Gallbladder wall measures 2 mm. No sonographic Murphy sign.

Common bile duct: Diameter: Normal at 4 mm.

Liver: No focal lesion identified. Within normal limits in
parenchymal echogenicity.

IVC: No abnormality visualized.

Pancreas: Visualized portion unremarkable.

Spleen: 9.1 cm.  Normal echotexture.

Right Kidney: Length: 12.2 cm. Echogenicity within normal limits. No
mass or hydronephrosis visualized.

Left Kidney: Length: 12.0 cm. Echogenicity within normal limits. No
mass or hydronephrosis visualized.

Abdominal aorta: No aneurysm visualized.

Other findings: None.
IMPRESSION: Cholelithiasis without cholecystitis.

## 2017-03-25 IMAGING — CR DG CHEST 2V
2 series · 2 of 2 positions shown · non-contrast
Comparison: None.

CLINICAL DATA: Chest pain, history of acid reflux

EXAM:
CHEST  2 VIEW

[w chest pa]
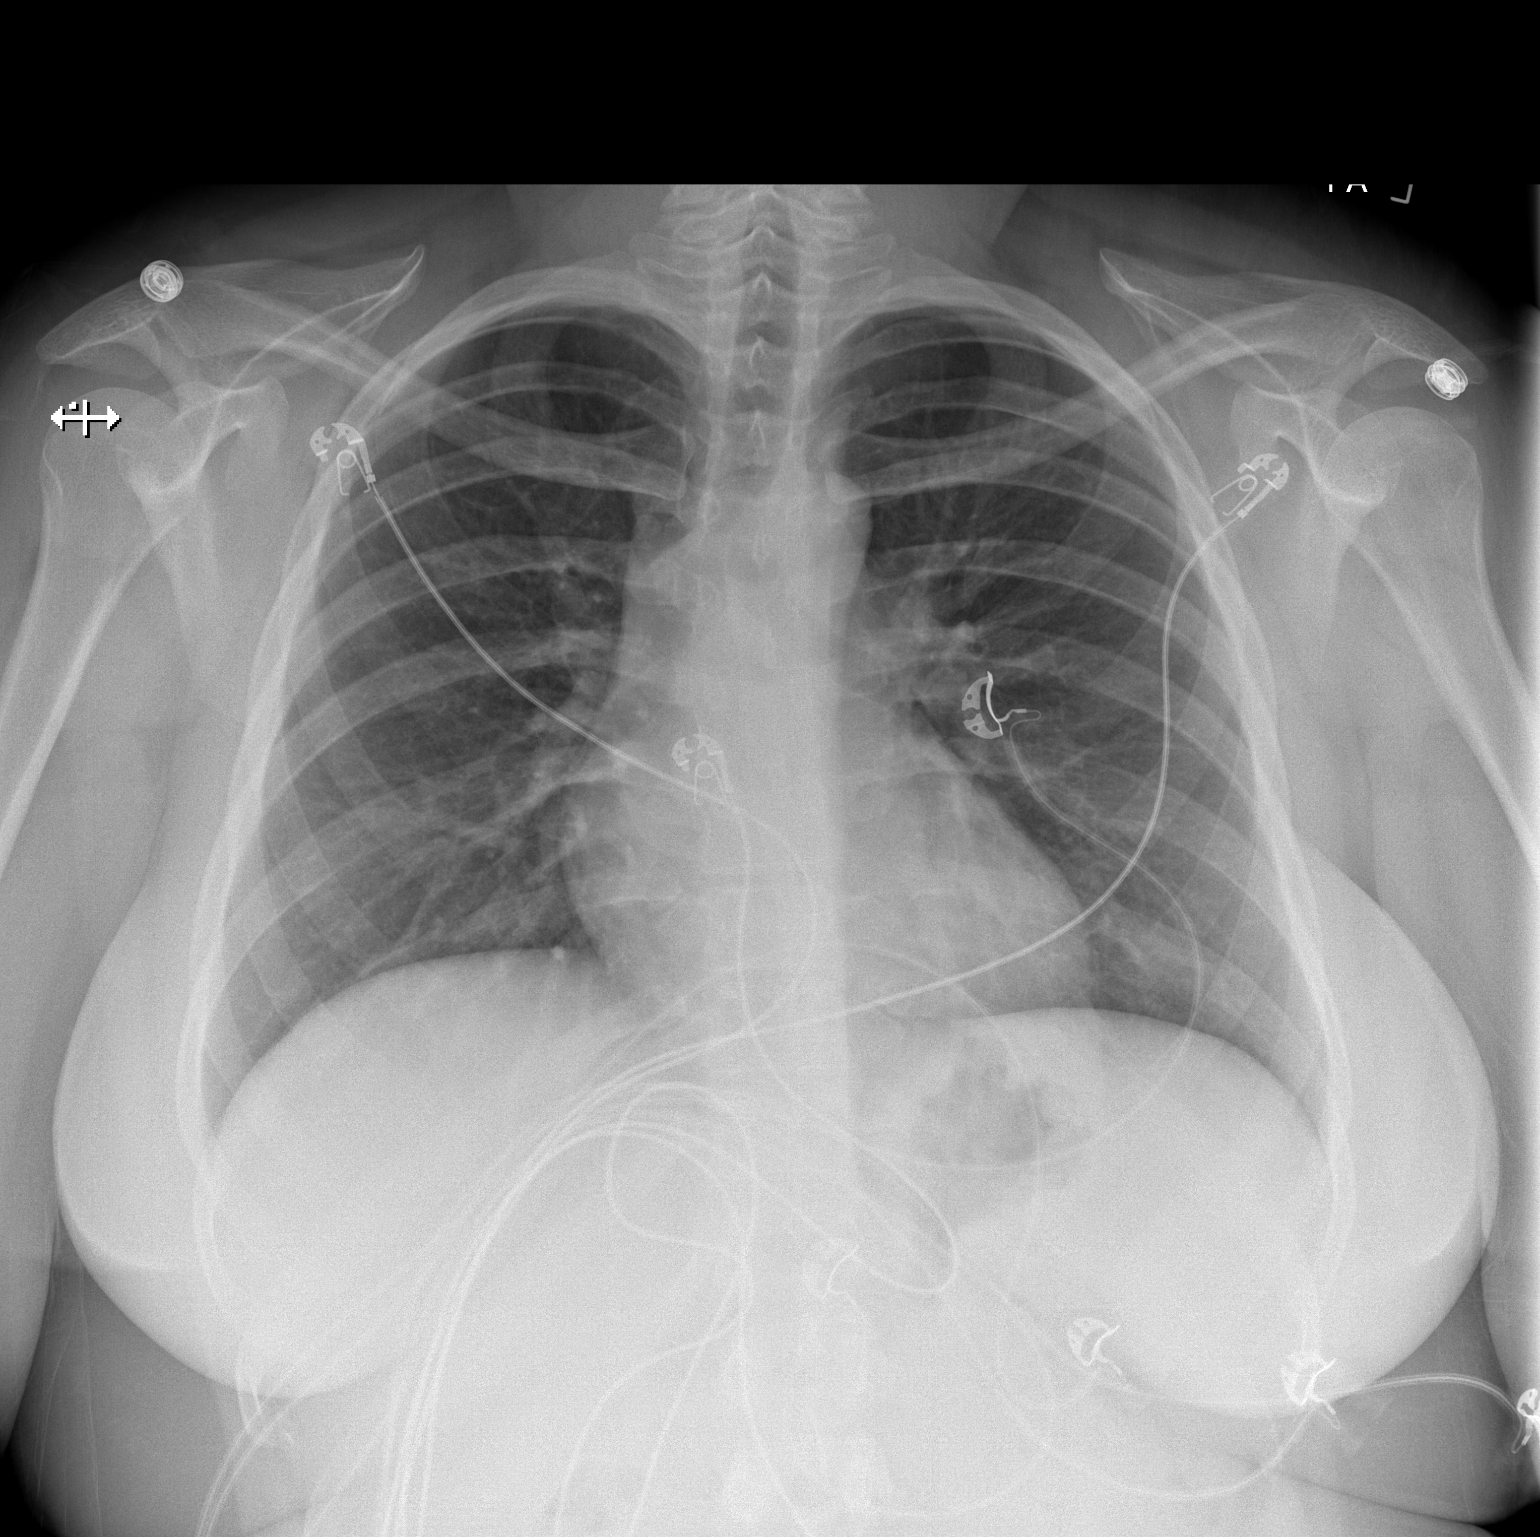

[w chest lat]
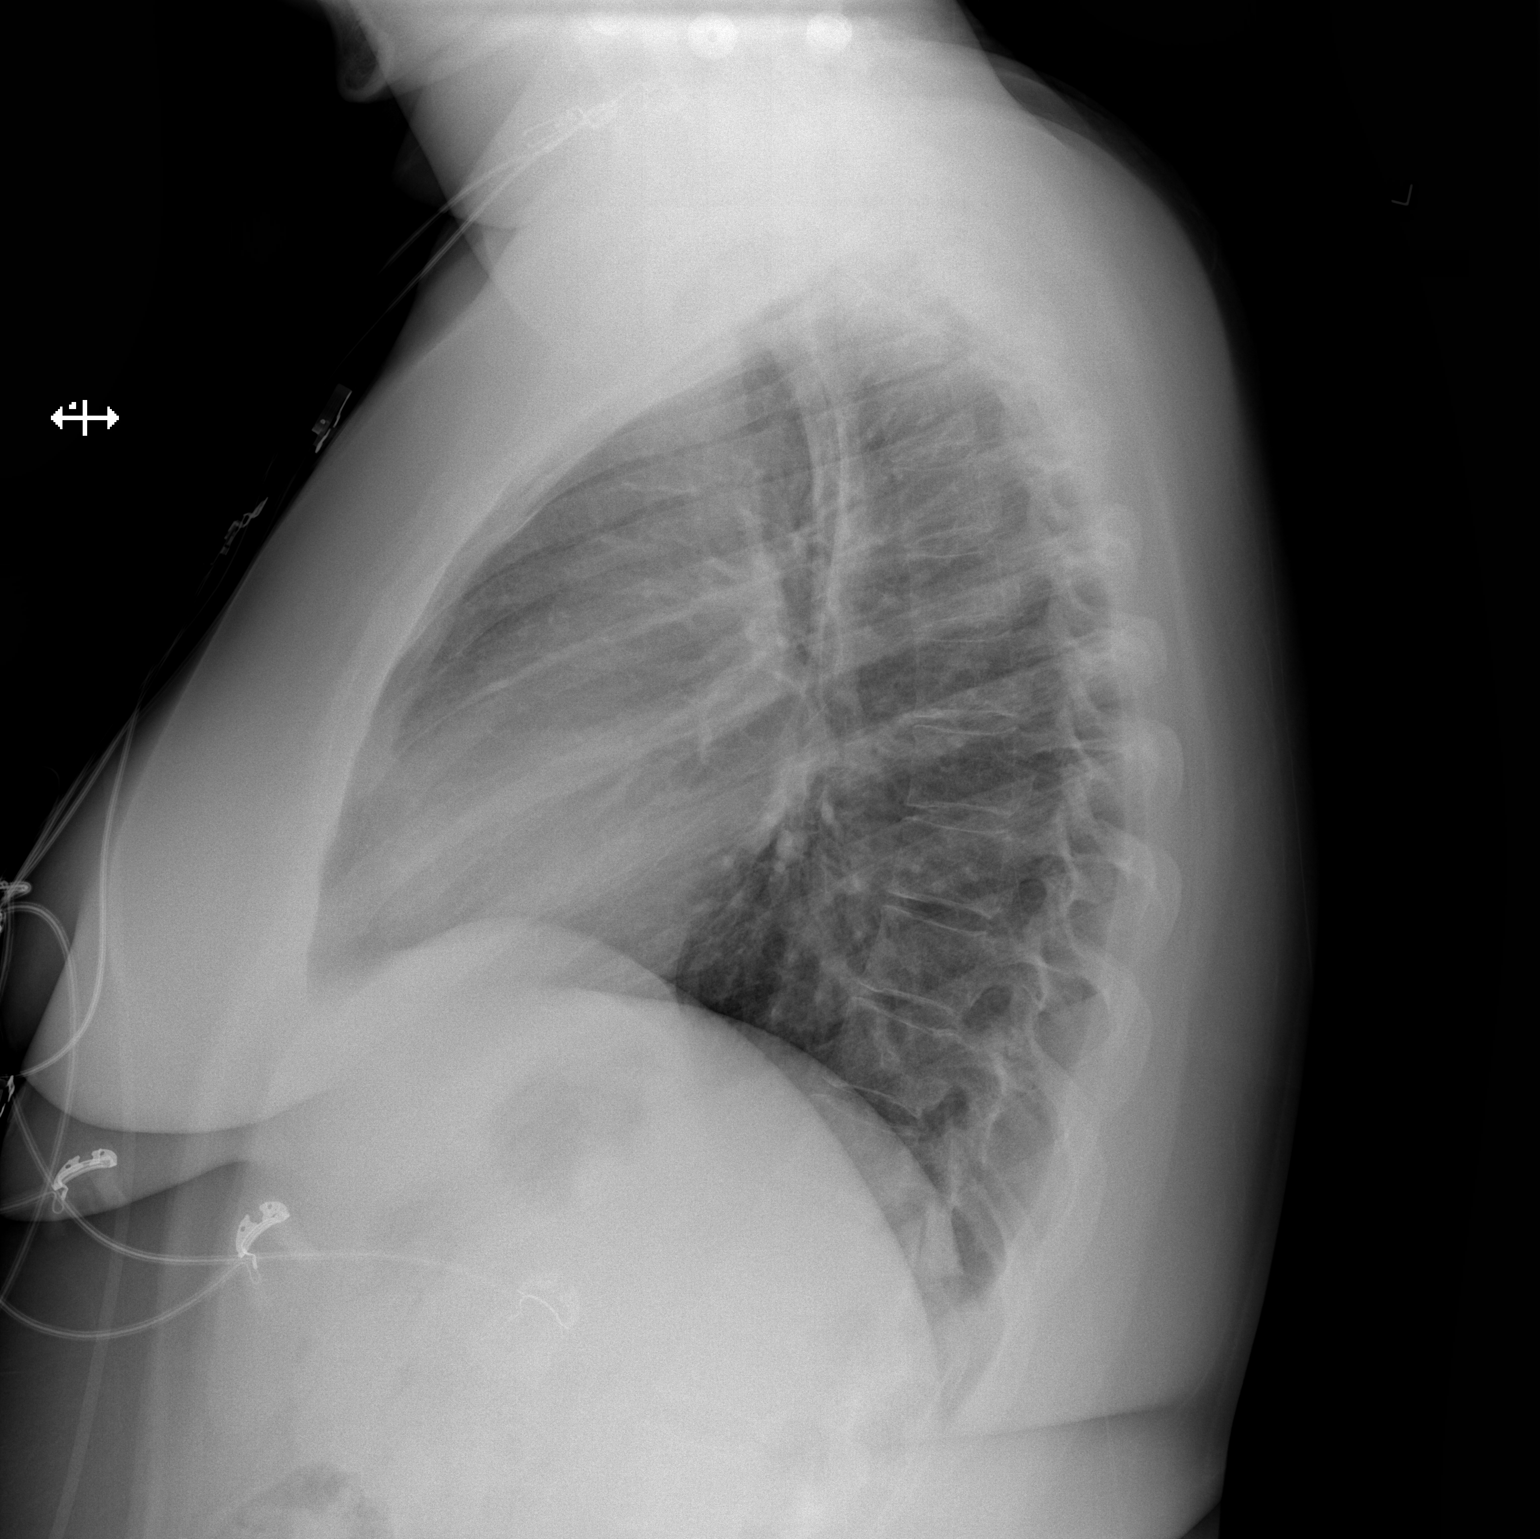

[2 of 2 positions shown; findings below may reference images not displayed]

FINDINGS: Cardiomediastinal silhouette is unremarkable. No acute infiltrate or
pleural effusion. No pulmonary edema. Bony thorax is unremarkable.
IMPRESSION: No active cardiopulmonary disease.

## 2017-04-01 ENCOUNTER — Other Ambulatory Visit (HOSPITAL_COMMUNITY): Payer: Self-pay | Admitting: Obstetrics and Gynecology

## 2017-04-01 DIAGNOSIS — O35BXX0 Maternal care for other (suspected) fetal abnormality and damage, fetal cardiac anomalies, not applicable or unspecified: Secondary | ICD-10-CM

## 2017-04-01 DIAGNOSIS — Z3689 Encounter for other specified antenatal screening: Secondary | ICD-10-CM

## 2017-04-01 DIAGNOSIS — O358XX0 Maternal care for other (suspected) fetal abnormality and damage, not applicable or unspecified: Secondary | ICD-10-CM

## 2017-04-08 ENCOUNTER — Encounter (HOSPITAL_COMMUNITY): Payer: Self-pay | Admitting: *Deleted

## 2017-04-09 ENCOUNTER — Other Ambulatory Visit (HOSPITAL_COMMUNITY): Payer: Self-pay | Admitting: Obstetrics and Gynecology

## 2017-04-09 ENCOUNTER — Encounter (HOSPITAL_COMMUNITY): Payer: Self-pay

## 2017-04-09 ENCOUNTER — Ambulatory Visit (HOSPITAL_COMMUNITY)
Admission: RE | Admit: 2017-04-09 | Discharge: 2017-04-09 | Disposition: A | Discharge status: Home / Self Care | Payer: Medicaid Other | Source: Ambulatory Visit | Attending: Obstetrics and Gynecology | Admitting: Obstetrics and Gynecology

## 2017-04-09 DIAGNOSIS — O99212 Obesity complicating pregnancy, second trimester: Secondary | ICD-10-CM | POA: Insufficient documentation

## 2017-04-09 DIAGNOSIS — Z3A25 25 weeks gestation of pregnancy: Secondary | ICD-10-CM | POA: Insufficient documentation

## 2017-04-09 DIAGNOSIS — Z363 Encounter for antenatal screening for malformations: Secondary | ICD-10-CM

## 2017-04-09 DIAGNOSIS — O321XX0 Maternal care for breech presentation, not applicable or unspecified: Secondary | ICD-10-CM | POA: Insufficient documentation

## 2017-04-09 DIAGNOSIS — O358XX0 Maternal care for other (suspected) fetal abnormality and damage, not applicable or unspecified: Secondary | ICD-10-CM | POA: Diagnosis not present

## 2017-04-09 DIAGNOSIS — O99210 Obesity complicating pregnancy, unspecified trimester: Secondary | ICD-10-CM

## 2017-04-09 DIAGNOSIS — O35BXX0 Maternal care for other (suspected) fetal abnormality and damage, fetal cardiac anomalies, not applicable or unspecified: Secondary | ICD-10-CM

## 2017-04-09 DIAGNOSIS — Z3689 Encounter for other specified antenatal screening: Secondary | ICD-10-CM

## 2017-04-09 DIAGNOSIS — E669 Obesity, unspecified: Secondary | ICD-10-CM | POA: Insufficient documentation

## 2017-05-12 NOTE — L&D Delivery Note (Signed)
Delivery Note Patient pushed well for 40 minutes.  At 1:04 AM a viable female was delivered via Vaginal, Spontaneous (Presentation: OA ).  APGAR: 8, 9; weight pending .   Placenta status: Spontaneous and in tact.  Cord: 3V with the following complications:none .  Cord pH: n/a  Anesthesia:  Epidural Episiotomy: None Lacerations: None Suture Repair: n/a Est. Blood Loss (mL): 100  Mom to postpartum.  Baby to Couplet care / Skin to Skin.  Lief Palmatier GEFFEL Jazsmin Couse 07/17/2017, 1:20 AM

## 2017-06-25 LAB — OB RESULTS CONSOLE GBS: STREP GROUP B AG: NEGATIVE

## 2017-06-28 ENCOUNTER — Inpatient Hospital Stay (HOSPITAL_COMMUNITY): Payer: Medicaid Other

## 2017-06-28 ENCOUNTER — Encounter (HOSPITAL_COMMUNITY): Payer: Self-pay

## 2017-06-28 ENCOUNTER — Inpatient Hospital Stay (HOSPITAL_COMMUNITY)
Admission: AD | Admit: 2017-06-28 | Discharge: 2017-06-28 | Disposition: A | Discharge status: Home / Self Care | Payer: Medicaid Other | Source: Ambulatory Visit | Attending: Obstetrics and Gynecology | Admitting: Obstetrics and Gynecology

## 2017-06-28 DIAGNOSIS — O368130 Decreased fetal movements, third trimester, not applicable or unspecified: Secondary | ICD-10-CM | POA: Diagnosis not present

## 2017-06-28 DIAGNOSIS — B379 Candidiasis, unspecified: Secondary | ICD-10-CM

## 2017-06-28 DIAGNOSIS — Z3A36 36 weeks gestation of pregnancy: Secondary | ICD-10-CM | POA: Diagnosis not present

## 2017-06-28 DIAGNOSIS — Z0371 Encounter for suspected problem with amniotic cavity and membrane ruled out: Secondary | ICD-10-CM

## 2017-06-28 DIAGNOSIS — O403XX0 Polyhydramnios, third trimester, not applicable or unspecified: Secondary | ICD-10-CM | POA: Diagnosis not present

## 2017-06-28 DIAGNOSIS — Z3689 Encounter for other specified antenatal screening: Secondary | ICD-10-CM

## 2017-06-28 LAB — WET PREP, GENITAL
Clue Cells Wet Prep HPF POC: NONE SEEN
Sperm: NONE SEEN
Trich, Wet Prep: NONE SEEN
YEAST WET PREP: NONE SEEN

## 2017-06-28 LAB — POCT FERN TEST: POCT FERN TEST: NEGATIVE

## 2017-06-28 LAB — AMNISURE RUPTURE OF MEMBRANE (ROM) NOT AT ARMC: Amnisure ROM: NEGATIVE

## 2017-06-28 MED ORDER — TERCONAZOLE 0.4 % VA CREA: 1.0000 | TOPICAL_CREAM | Freq: Every day | VAGINAL | 0 refills | Status: DC

## 2017-06-28 NOTE — Discharge Instructions (Signed)
Polyhydramnios When a woman becomes pregnant, a sac is formed around the fertilized egg (embryo) and later the growing baby (fetus). This sac is called the amniotic sac. The amniotic sac is filled with fluid. It gets bigger as the pregnancy grows. When there is too much fluid in the sac, it is called polyhydramnios. All babies born with polyhydramnios should be checked for congenital abnormalities. The amniotic fluid cushions and protects the baby. It also provides the baby with fluids and is crucial to normal development. Your baby breathes this fluid into its lungs and swallows it. This helps promote the healthy growth of the lungs and gastrointestinal tract. Amniotic fluid also helps the baby move around, helping with the normal development of muscle and bone. What are the causes? This condition is caused by:  Diabetes mellitus.  Down's syndrome, fetal abnormalities of the intestinal tract, and anencephaly (fetus without brain), all of which can prevent the fetus from swallowing the amniotic fluid.  One twins passes (transfuses) its blood into the other twin (twin-twin transfusion syndrome).  Medical illness of the mother, e.g., kidney or heart disease.  Tumor (chorioangioma) of the placenta.  What are the signs or symptoms? Symptoms of this condition include:  Enlarged uterus. The size of the uterus enlarges beyond what it should be for that particular time of the pregnancy.  Pressure and discomfort. The mother may feel more pressure and discomfort than should be expected.  Quick and unexpected enlargement of the mother's stomach.  How is this diagnosed? This condition is diagnosed when your health care provider measures you and notices that your uterus is beyond the size that is consistent with the time of the pregnancy.  An ultrasound is then used (abdominally or vaginally) to: ? See if you are carrying twins or more. ? Measure the growth of the baby. ? Look for birth  defects. ? Measure the amount of fluid in the amniotic sac.  Amniotic Fluid Index (AFI) measures the amount of fluid in the amniotic sac in four different areas. If there is more than 24 centimeters, you have polyhydramnios. How is this treated? This condition may be treated by:  Removing some fluid from the amniotic sac.  Taking medications that lower the fluids in your body.  Stopping the use of salt or salty foods because it causes you to keep fluid in your body (retention).  Follow these instructions at home:  Keep all your prenatal visits as told by your health care provider. It is important to follow your health care provider's recommendations.  Do not eat a lot of salt and salty foods.  If you have diabetes, keep it under control.  If you have heart or kidney disease, treat the disease as told by your health care provider. Contact a health care provider if:  You think your uterus has grown too fast in a short period of time.  You feel a great amount of pressure in your lower belly (pelvis) and are more uncomfortable than expected. Get help right away if:  You have a gush of fluid or are leaking fluid from your vagina.  You stop feeling the baby move.  You do not feel the baby kicking as much as usual.  You have a hard time keeping your diabetes under control.  You are having problems with your heart or kidney disease. This information is not intended to replace advice given to you by your health care provider. Make sure you discuss any questions you have with your health   care provider. Document Released: 07/19/2002 Document Revised: 10/04/2015 Document Reviewed: 11/25/2012 Elsevier Interactive Patient Education  2018 [employer]  

## 2017-06-28 NOTE — MAU Provider Note (Signed)
S: Ms. Lori ChardKristin Li is a 27 y.o. G3P1011 at 4039w3d  who presents to MAU today complaining of leaking of fluid since 0900. She denies vaginal bleeding. She endorses contractions. She reports normal fetal movement.    O: BP 138/84 (BP Location: Right Arm)   Pulse (!) 116   Temp 98.3 F (36.8 C) (Oral)   Resp 18   Ht 5\' 4"  (1.626 m)   Wt 253 lb 12.8 oz (115.1 kg)   LMP 10/16/2016   BMI 43.56 kg/m  GENERAL: Well-developed, well-nourished female in no acute distress.  HEAD: Normocephalic, atraumatic.  CHEST: Normal effort of breathing, regular heart rate ABDOMEN: Soft, nontender, gravid PELVIC: Normal external female genitalia. Vagina is pink and rugated. Cervix with normal contour, no lesions. Normal discharge.  negative pooling. Small amount of thick, clumpy white discharge noted on vaginal wall. Tenderness noted with speculum exam.   Cervical exam:  Dilation: 1 Effacement (%): Thick Station: Ballotable Exam by:: j Jaria Conway np  Fetal Monitoring: Baseline: 125 bpm Variability: moderate  Accelerations: 15x15 Decelerations: none Contractions: Occasional   Results for orders placed or performed during the hospital encounter of 06/28/17 (from the past 24 hour(s))  Am[facility] rupture of membrane (rom)not at [facility]     Status: None   Collection Time: 06/28/17 11:55 AM  Result Value Ref Range   Amnisure ROM NEGATIVE   Wet prep, genital     Status: Abnormal   Collection Time: 06/28/17 11:55 AM  Result Value Ref Range   Yeast Wet Prep HPF POC NONE SEEN NONE SEEN   Trich, Wet Prep NONE SEEN NONE SEEN   Clue Cells Wet Prep HPF POC NONE SEEN NONE SEEN   WBC, Wet Prep HPF POC FEW (A) NONE SEEN   Sperm NONE SEEN   POCT fern test     Status: None   Collection Time: 06/28/17 12:10 PM  Result Value Ref Range   POCT Fern Test Negative = intact amniotic membranes    MDM: Patient feeling some movement since arrival  Discussed HPI, labs and US with Dr. Henderson CloudHorvath. BPP 8/8 Ok for DC home.     A/P SIUP at 8939w3d  Membranes intact likely yeast  Polyhydramnios' Decreased fetal movement Reactive NST BPP 10/10  Strict return precautions Kick counts Keep your appointment in the office next week.  Rx: Terazol 7   Lori Li, Harolyn RutherfordJennifer I, NP 06/28/2017 12:46 PM

## 2017-06-28 NOTE — MAU Note (Signed)
Urine in lab 

## 2017-06-28 NOTE — MAU Note (Signed)
Was at church this AM and was moving around- noticed panties were soaked through into her jeans. Now she feels wet but is unsure if it's still leaking. Pink tinged.  Feels some cramping.  No vaginal bleeding  Some decreased movement- has felt 1 movement since 0930

## 2017-07-10 ENCOUNTER — Inpatient Hospital Stay (HOSPITAL_COMMUNITY)
Admission: AD | Admit: 2017-07-10 | Discharge: 2017-07-10 | Disposition: A | Discharge status: Home / Self Care | Payer: Medicaid Other | Source: Ambulatory Visit | Attending: Obstetrics | Admitting: Obstetrics

## 2017-07-10 ENCOUNTER — Encounter (HOSPITAL_COMMUNITY): Payer: Self-pay | Admitting: *Deleted

## 2017-07-10 DIAGNOSIS — Z3A38 38 weeks gestation of pregnancy: Secondary | ICD-10-CM

## 2017-07-10 DIAGNOSIS — R030 Elevated blood-pressure reading, without diagnosis of hypertension: Secondary | ICD-10-CM | POA: Diagnosis present

## 2017-07-10 DIAGNOSIS — O133 Gestational [pregnancy-induced] hypertension without significant proteinuria, third trimester: Secondary | ICD-10-CM | POA: Diagnosis not present

## 2017-07-10 LAB — CBC
HEMATOCRIT: 33.5 % — AB (ref 36.0–46.0)
HEMOGLOBIN: 11.2 g/dL — AB (ref 12.0–15.0)
MCH: 27.8 pg (ref 26.0–34.0)
MCHC: 33.4 g/dL (ref 30.0–36.0)
MCV: 83.1 fL (ref 78.0–100.0)
Platelets: 220 10*3/uL (ref 150–400)
RBC: 4.03 MIL/uL (ref 3.87–5.11)
RDW: 14.9 % (ref 11.5–15.5)
WBC: 9.3 10*3/uL (ref 4.0–10.5)

## 2017-07-10 LAB — COMPREHENSIVE METABOLIC PANEL
ALBUMIN: 3.1 g/dL — AB (ref 3.5–5.0)
ALK PHOS: 108 U/L (ref 38–126)
ALT: 11 U/L — AB (ref 14–54)
ANION GAP: 9 (ref 5–15)
AST: 17 U/L (ref 15–41)
BILIRUBIN TOTAL: 0.7 mg/dL (ref 0.3–1.2)
BUN: 8 mg/dL (ref 6–20)
CALCIUM: 9 mg/dL (ref 8.9–10.3)
CO2: 17 mmol/L — AB (ref 22–32)
Chloride: 108 mmol/L (ref 101–111)
Creatinine, Ser: 0.32 mg/dL — ABNORMAL LOW (ref 0.44–1.00)
GFR calc Af Amer: >60 mL/min (ref >60)
GFR calc non Af Amer: >60 mL/min (ref >60)
GLUCOSE: 80 mg/dL (ref 65–99)
Potassium: 3.9 mmol/L (ref 3.5–5.1)
SODIUM: 134 mmol/L — AB (ref 135–145)
TOTAL PROTEIN: 6.7 g/dL (ref 6.5–8.1)

## 2017-07-10 LAB — PROTEIN / CREATININE RATIO, URINE
Creatinine, Urine: 159 mg/dL
PROTEIN CREATININE RATIO: 0.08 mg/mg{creat} (ref 0.00–0.15)
Total Protein, Urine: 12 mg/dL

## 2017-07-10 NOTE — MAU Note (Signed)
Patient was seen in the office and sent over for BP 140s/80s.  Pt has hx of PIH in last pregnancy.  Denies HA, visual changes or any other PIH symptoms.

## 2017-07-10 NOTE — Discharge Instructions (Signed)
Hypertension During Pregnancy °Hypertension, commonly called high blood pressure, is when the force of blood pumping through your arteries is too strong. Arteries are blood vessels that carry blood from the heart throughout the body. Hypertension during pregnancy can cause problems for you and your baby. Your baby may be born early (prematurely) or may not weigh as much as he or she should at birth. Very bad cases of hypertension during pregnancy can be life-threatening. °Different types of hypertension can occur during pregnancy. These include: °· Chronic hypertension. This happens when: °? You have hypertension before pregnancy and it continues during pregnancy. °? You develop hypertension before you are [redacted] weeks pregnant, and it continues during pregnancy. °· Gestational hypertension. This is hypertension that develops after the 20th week of pregnancy. °· Preeclampsia, also called toxemia of pregnancy. This is a very serious type of hypertension that develops only during pregnancy. It affects the whole body, and it can be very dangerous for you and your baby. ° °Gestational hypertension and preeclampsia usually go away within 6 weeks after your baby is born. Women who have hypertension during pregnancy have a greater chance of developing hypertension later in life or during future pregnancies. °What are the causes? °The exact cause of hypertension is not known. °What increases the risk? °There are certain factors that make it more likely for you to develop hypertension during pregnancy. These include: °· Having hypertension during a previous pregnancy or prior to pregnancy. °· Being overweight. °· Being older than age 40. °· Being pregnant for the first time or being pregnant with more than one baby. °· Becoming pregnant using fertilization methods such as IVF (in vitro fertilization). °· Having diabetes, kidney problems, or systemic lupus erythematosus. °· Having a family history of hypertension. ° °What are the  signs or symptoms? °Chronic hypertension and gestational hypertension rarely cause symptoms. Preeclampsia causes symptoms, which may include: °· Increased protein in your urine. Your health care provider will check for this at every visit before you give birth (prenatal visit). °· Severe headaches. °· Sudden weight gain. °· Swelling of the hands, face, legs, and feet. °· Nausea and vomiting. °· Vision problems, such as blurred or double vision. °· Numbness in the face, arms, legs, and feet. °· Dizziness. °· Slurred speech. °· Sensitivity to bright lights. °· Abdominal pain. °· Convulsions. ° °How is this diagnosed? °You may be diagnosed with hypertension during a routine prenatal exam. At each prenatal visit, you may: °· Have a urine test to check for high amounts of protein in your urine. °· Have your blood pressure checked. A blood pressure reading is recorded as two numbers, such as "120 over 80" (or 120/80). The first ("top") number is called the systolic pressure. It is a measure of the pressure in your arteries when your heart beats. The second ("bottom") number is called the diastolic pressure. It is a measure of the pressure in your arteries as your heart relaxes between beats. Blood pressure is measured in a unit called mm Hg. A normal blood pressure reading is: °? Systolic: below 120. °? Diastolic: below 80. ° °The type of hypertension that you are diagnosed with depends on your test results and when your symptoms developed. °· Chronic hypertension is usually diagnosed before 20 weeks of pregnancy. °· Gestational hypertension is usually diagnosed after 20 weeks of pregnancy. °· Hypertension with high amounts of protein in the urine is diagnosed as preeclampsia. °· Blood pressure measurements that stay above 160 systolic, or above 110 diastolic, are   signs of severe preeclampsia. ° °How is this treated? °Treatment for hypertension during pregnancy varies depending on the type of hypertension you have and how  serious it is. °· If you take medicines called ACE inhibitors to treat chronic hypertension, you may need to switch medicines. ACE inhibitors should not be taken during pregnancy. °· If you have gestational hypertension, you may need to take blood pressure medicine. °· If you are at risk for preeclampsia, your health care provider may recommend that you take a low-dose aspirin every day to prevent high blood pressure during your pregnancy. °· If you have severe preeclampsia, you may need to be hospitalized so you and your baby can be monitored closely. You may also need to take medicine (magnesium sulfate) to prevent seizures and to lower blood pressure. This medicine may be given as an injection or through an IV tube. °· In some cases, if your condition gets worse, you may need to deliver your baby early. ° °Follow these instructions at home: °Eating and drinking °· Drink enough fluid to keep your urine clear or pale yellow. °· Eat a healthy diet that is low in salt (sodium). Do not add salt to your food. Check food labels to see how much sodium a food or beverage contains. °Lifestyle °· Do not use any products that contain nicotine or tobacco, such as cigarettes and e-cigarettes. If you need help quitting, ask your health care provider. °· Do not use alcohol. °· Avoid caffeine. °· Avoid stress as much as possible. Rest and get plenty of sleep. °General instructions °· Take over-the-counter and prescription medicines only as told by your health care provider. °· While lying down, lie on your left side. This keeps pressure off your baby. °· While sitting or lying down, raise (elevate) your feet. Try putting some pillows under your lower legs. °· Exercise regularly. Ask your health care provider what kinds of exercise are best for you. °· Keep all prenatal and follow-up visits as told by your health care provider. This is important. °Contact a health care provider if: °· You have symptoms that your health care  provider told you may require more treatment or monitoring, such as: °? Fever. °? Vomiting. °? Headache. °Get help right away if: °· You have severe abdominal pain or vomiting that does not get better with treatment. °· You suddenly develop swelling in your hands, ankles, or face. °· You gain 4 lbs (1.8 kg) or more in 1 week. °· You develop vaginal bleeding, or you have blood in your urine. °· You do not feel your baby moving as much as usual. °· You have blurred or double vision. °· You have muscle twitching or sudden tightening (spasms). °· You have shortness of breath. °· Your lips or fingernails turn blue. °This information is not intended to replace advice given to you by your health care provider. Make sure you discuss any questions you have with your health care provider. °Document Released: 01/14/2011 Document Revised: 11/16/2015 Document Reviewed: 10/12/2015 °Elsevier Interactive Patient Education © 2018 [employer] ° °

## 2017-07-10 NOTE — MAU Note (Signed)
Urine in lab 

## 2017-07-10 NOTE — MAU Provider Note (Signed)
History     CSN: [id number]  Arrival date and time: 07/10/17 1312   First Provider Initiated Contact with Patient 07/10/17 1421      Chief Complaint  Patient presents with  . Hypertension   Lori Li is a 27 y.o. G3P1011 at [redacted]w[redacted]d who presents today from the office 2/2 high blood pressure. She reports that during her visit around 11:30 today she had a b/p of 140/90. She denies any HA, visual disturbances or RUQ pain. She reports normal fetal movement. She denies any contractions or VB. She reports hx of GHTN with her last pregnancy, and was induced around 38/39 weeks for this. She denies HTN outside of pregnancy. She is not on any anti-hypertensivse agents.    Hypertension  This is a new problem. The current episode started today. The problem is unchanged. Pertinent negatives include no headaches. Associated agents: pregnancy  There are no known risk factors for coronary artery disease. Past treatments include nothing.   Past Medical History:  Diagnosis Date  . Anemia    this pregnancy - on iron  . Hypertension    gestational hypertension    Past Surgical History:  Procedure Laterality Date  . CHOLECYSTECTOMY      Family History  Problem Relation Age of Onset  . Heart disease Mother   . Diabetes Father     Social History   Tobacco Use  . Smoking status: Never Smoker  . Smokeless tobacco: Never Used  Substance Use Topics  . Alcohol use: No  . Drug use: No    Allergies: No Known Allergies  Medications Prior to Admission  Medication Sig Dispense Refill Last Dose  . ferrous sulfate 325 (65 FE) MG tablet Take 325 mg by mouth at bedtime.   Past Week at Unknown time  . Prenatal Vit-Fe Fumarate-FA (PRENATAL MULTIVITAMIN) TABS tablet Take 1 tablet by mouth at bedtime.   Past Week at Unknown time  . ranitidine (ZANTAC) 75 MG tablet Take 75 mg by mouth at bedtime.   06/27/2017 at Unknown time  . terconazole (TERAZOL 7) 0.4 % vaginal cream Place 1 applicator vaginally at  bedtime. 45 g 0     Review of Systems  Eyes: Negative for visual disturbance.  Gastrointestinal: Negative for abdominal pain.  Genitourinary: Negative for pelvic pain, vaginal bleeding and vaginal discharge.  Neurological: Negative for headaches.   Physical Exam   Blood pressure 120/69, pulse (!) 105, temperature 97.9 F (36.6 C), temperature source Oral, resp. rate 20, weight 250 lb 4 oz (113.5 kg), last menstrual period 10/16/2016, SpO2 92 %.  Physical Exam  Nursing note and vitals reviewed. Constitutional: She is oriented to person, place, and time. She appears well-developed and well-nourished. No distress.  HENT:  Head: Normocephalic.  Cardiovascular: Normal rate.  Respiratory: Effort normal.  GI: Soft. There is no tenderness. There is no rebound.  Neurological: She is alert and oriented to person, place, and time. She has normal reflexes.  No clonus   Skin: Skin is warm and dry.  Psychiatric: She has a normal mood and affect.  FHT: 135, moderate with 15x15 accels, no decels Toco: no UCs   Results for orders placed or performed during the hospital encounter of 07/10/17 (from the past 24 hour(s))  CBC     Status: Abnormal   Collection Time: 07/10/17  2:50 PM  Result Value Ref Range   WBC 9.3 4.0 - 10.5 K/uL   RBC 4.03 3.87 - 5.11 MIL/uL   Hemoglobin 11.2 (L) 12.0 -  15.0 g/dL   HCT 16.133.5 (L) 09.636.0 - 04.546.0 %   MCV 83.1 78.0 - 100.0 fL   MCH 27.8 26.0 - 34.0 pg   MCHC 33.4 30.0 - 36.0 g/dL   RDW 40.914.9 81.111.5 - 91.415.5 %   Platelets 220 150 - 400 K/uL  Comprehensive metabolic panel     Status: Abnormal   Collection Time: 07/10/17  2:50 PM  Result Value Ref Range   Sodium 134 (L) 135 - 145 mmol/L   Potassium 3.9 3.5 - 5.1 mmol/L   Chloride 108 101 - 111 mmol/L   CO2 17 (L) 22 - 32 mmol/L   Glucose, Bld 80 65 - 99 mg/dL   BUN 8 6 - 20 mg/dL   Creatinine, Ser 7.820.32 (L) 0.44 - 1.00 mg/dL   Calcium 9.0 8.9 - 95.610.3 mg/dL   Total Protein 6.7 6.5 - 8.1 g/dL   Albumin 3.1 (L) 3.5 -  5.0 g/dL   AST 17 15 - 41 U/L   ALT 11 (L) 14 - 54 U/L   Alkaline Phosphatase 108 38 - 126 U/L   Total Bilirubin 0.7 0.3 - 1.2 mg/dL   GFR calc non Af Amer >60 >60 mL/min   GFR calc Af Amer >60 >60 mL/min   Anion gap 9 5 - 15     MAU Course  Procedures  MDM 1617: DW Dr. Chestine Sporelark, ok for DC home. UPC still pending, but ok for DC. FU as planned for BP check on Monday.   Assessment and Plan   1. Hypertension in pregnancy, transient, antepartum, third trimester   2. [redacted] weeks gestation of pregnancy    DC home Comfort measures reviewed  3rd Trimester precautions  labor precautions  Fetal kick counts Pre-eclampsia warning signs  RX: none  Return to MAU as needed FU with OB as planned  Follow-up Information    [name], [name], MD Follow up.   Specialty:  Obstetrics Contact information: [address] [place] [address] [phone]            Thressa ShellerHeather Kasondra Junod 07/10/2017, 2:40 PM

## 2017-07-16 ENCOUNTER — Inpatient Hospital Stay (HOSPITAL_COMMUNITY)
Admission: RE | Admit: 2017-07-16 | Discharge: 2017-07-18 | DRG: 807 | Disposition: A | Discharge status: Home / Self Care | Payer: Medicaid Other | Source: Ambulatory Visit | Attending: Obstetrics | Admitting: Obstetrics

## 2017-07-16 ENCOUNTER — Inpatient Hospital Stay (HOSPITAL_COMMUNITY): Payer: Medicaid Other | Admitting: Anesthesiology

## 2017-07-16 ENCOUNTER — Encounter (HOSPITAL_COMMUNITY): Payer: Self-pay

## 2017-07-16 DIAGNOSIS — O9902 Anemia complicating childbirth: Secondary | ICD-10-CM | POA: Diagnosis present

## 2017-07-16 DIAGNOSIS — Z3A39 39 weeks gestation of pregnancy: Secondary | ICD-10-CM | POA: Diagnosis not present

## 2017-07-16 DIAGNOSIS — O403XX0 Polyhydramnios, third trimester, not applicable or unspecified: Secondary | ICD-10-CM | POA: Diagnosis present

## 2017-07-16 DIAGNOSIS — D649 Anemia, unspecified: Secondary | ICD-10-CM | POA: Diagnosis present

## 2017-07-16 DIAGNOSIS — O99214 Obesity complicating childbirth: Secondary | ICD-10-CM | POA: Diagnosis present

## 2017-07-16 HISTORY — DX: Personal history of other complications of pregnancy, childbirth and the puerperium: Z87.59

## 2017-07-16 LAB — CBC
HCT: 32.9 % — ABNORMAL LOW (ref 36.0–46.0)
Hemoglobin: 11 g/dL — ABNORMAL LOW (ref 12.0–15.0)
MCH: 27.6 pg (ref 26.0–34.0)
MCHC: 33.4 g/dL (ref 30.0–36.0)
MCV: 82.5 fL (ref 78.0–100.0)
PLATELETS: 191 10*3/uL (ref 150–400)
RBC: 3.99 MIL/uL (ref 3.87–5.11)
RDW: 15.2 % (ref 11.5–15.5)
WBC: 8 10*3/uL (ref 4.0–10.5)

## 2017-07-16 LAB — TYPE AND SCREEN
ABO/RH(D): A POS
Antibody Screen: NEGATIVE

## 2017-07-16 LAB — RPR: RPR: NONREACTIVE

## 2017-07-16 MED ORDER — LACTATED RINGERS IV SOLN
INTRAVENOUS | Status: DC
  Administered 2017-07-16 (×3): via INTRAVENOUS

## 2017-07-16 MED ORDER — PHENYLEPHRINE 40 MCG/ML (10ML) SYRINGE FOR IV PUSH (FOR BLOOD PRESSURE SUPPORT)
80.0000 ug | PREFILLED_SYRINGE | INTRAVENOUS | Status: AC | PRN
  Administered 2017-07-16 (×3): 80 ug via INTRAVENOUS

## 2017-07-16 MED ORDER — LACTATED RINGERS IV SOLN
500.0000 mL | Freq: Once | INTRAVENOUS | Status: AC
  Administered 2017-07-16: 500 mL via INTRAVENOUS

## 2017-07-16 MED ORDER — PHENYLEPHRINE 40 MCG/ML (10ML) SYRINGE FOR IV PUSH (FOR BLOOD PRESSURE SUPPORT)
80.0000 ug | PREFILLED_SYRINGE | INTRAVENOUS | Status: DC | PRN
  Filled 2017-07-16: qty 5
  Filled 2017-07-16: qty 10

## 2017-07-16 MED ORDER — OXYCODONE-ACETAMINOPHEN 5-325 MG PO TABS: 2.0000 | ORAL_TABLET | ORAL | Status: DC | PRN

## 2017-07-16 MED ORDER — EPHEDRINE 5 MG/ML INJ
10.0000 mg | INTRAVENOUS | Status: DC | PRN
Start: 2017-07-16 — End: 2017-07-17
  Filled 2017-07-16: qty 2

## 2017-07-16 MED ORDER — OXYCODONE-ACETAMINOPHEN 5-325 MG PO TABS: 1.0000 | ORAL_TABLET | ORAL | Status: DC | PRN

## 2017-07-16 MED ORDER — LACTATED RINGERS IV SOLN
500.0000 mL | INTRAVENOUS | Status: DC | PRN
  Administered 2017-07-16: 300 mL via INTRAVENOUS
  Administered 2017-07-16: 500 mL via INTRAVENOUS

## 2017-07-16 MED ORDER — LIDOCAINE HCL (PF) 1 % IJ SOLN
INTRAMUSCULAR | Status: DC | PRN
  Administered 2017-07-16: 8 mL via EPIDURAL
  Administered 2017-07-16: 4 mL via EPIDURAL

## 2017-07-16 MED ORDER — DIPHENHYDRAMINE HCL 50 MG/ML IJ SOLN: 12.5000 mg | INTRAMUSCULAR | Status: DC | PRN

## 2017-07-16 MED ORDER — ONDANSETRON HCL 4 MG/2ML IJ SOLN
4.0000 mg | Freq: Four times a day (QID) | INTRAMUSCULAR | Status: DC | PRN
  Administered 2017-07-16: 4 mg via INTRAVENOUS
  Filled 2017-07-16: qty 2

## 2017-07-16 MED ORDER — SOD CITRATE-CITRIC ACID 500-334 MG/5ML PO SOLN
30.0000 mL | ORAL | Status: DC | PRN
  Administered 2017-07-16: 30 mL via ORAL
  Filled 2017-07-16: qty 15

## 2017-07-16 MED ORDER — FENTANYL 2.5 MCG/ML BUPIVACAINE 1/10 % EPIDURAL INFUSION (WH - ANES)
14.0000 mL/h | INTRAMUSCULAR | Status: DC | PRN
  Administered 2017-07-16 (×2): 14 mL/h via EPIDURAL
  Filled 2017-07-16 (×2): qty 100

## 2017-07-16 MED ORDER — OXYTOCIN 40 UNITS IN LACTATED RINGERS INFUSION - SIMPLE MED: 2.5000 [IU]/h | INTRAVENOUS | Status: DC

## 2017-07-16 MED ORDER — ACETAMINOPHEN 325 MG PO TABS: 650.0000 mg | ORAL_TABLET | ORAL | Status: DC | PRN

## 2017-07-16 MED ORDER — TERBUTALINE SULFATE 1 MG/ML IJ SOLN
0.2500 mg | Freq: Once | INTRAMUSCULAR | Status: DC | PRN
  Filled 2017-07-16: qty 1

## 2017-07-16 MED ORDER — OXYTOCIN BOLUS FROM INFUSION
500.0000 mL | Freq: Once | INTRAVENOUS | Status: AC
  Administered 2017-07-17: 500 mL via INTRAVENOUS

## 2017-07-16 MED ORDER — FENTANYL CITRATE (PF) 100 MCG/2ML IJ SOLN: 50.0000 ug | INTRAMUSCULAR | Status: DC | PRN

## 2017-07-16 MED ORDER — LIDOCAINE HCL (PF) 1 % IJ SOLN
30.0000 mL | INTRAMUSCULAR | Status: DC | PRN
  Filled 2017-07-16: qty 30

## 2017-07-16 MED ORDER — EPHEDRINE 5 MG/ML INJ
10.0000 mg | INTRAVENOUS | Status: DC | PRN
  Filled 2017-07-16: qty 2

## 2017-07-16 MED ORDER — FAMOTIDINE 20 MG PO TABS
40.0000 mg | ORAL_TABLET | Freq: Once | ORAL | Status: AC
  Administered 2017-07-16: 40 mg via ORAL
  Filled 2017-07-16: qty 2

## 2017-07-16 MED ORDER — OXYTOCIN 40 UNITS IN LACTATED RINGERS INFUSION - SIMPLE MED
1.0000 m[IU]/min | INTRAVENOUS | Status: DC
  Administered 2017-07-16: 2 m[IU]/min via INTRAVENOUS
  Filled 2017-07-16: qty 1000

## 2017-07-16 NOTE — Anesthesia Preprocedure Evaluation (Signed)
Anesthesia Evaluation  Patient identified by MRN, date of birth, ID band Patient awake    Reviewed: Allergy & Precautions, H&P , NPO status , Patient's Chart, lab work & pertinent test results  History of Anesthesia Complications Negative for: history of anesthetic complications  Airway Mallampati: II  TM Distance: >3 FB Neck ROM: full    Dental no notable dental hx. (+) Teeth Intact   Pulmonary neg pulmonary ROS,    Pulmonary exam normal breath sounds clear to auscultation       Cardiovascular  Rhythm:regular Rate:Normal     Neuro/Psych negative neurological ROS  negative psych ROS   GI/Hepatic negative GI ROS, Neg liver ROS,   Endo/Other  Morbid obesity  Renal/GU negative Renal ROS  negative genitourinary   Musculoskeletal   Abdominal Normal abdominal exam  (+) + obese,   Peds  Hematology  (+) anemia ,   Anesthesia Other Findings   Reproductive/Obstetrics (+) Pregnancy Hx gestational HTN                            Anesthesia Physical  Anesthesia Plan  ASA: III  Anesthesia Plan: Epidural   Post-op Pain Management:    Induction:   PONV Risk Score and Plan:   Airway Management Planned: Natural Airway  Additional Equipment:   Intra-op Plan:   Post-operative Plan:   Informed Consent: I have reviewed the patients History and Physical, chart, labs and discussed the procedure including the risks, benefits and alternatives for the proposed anesthesia with the patient or authorized representative who has indicated his/her understanding and acceptance.     Plan Discussed with:   Anesthesia Plan Comments: (Labs reviewed. Platelets acceptable, patient not taking any blood thinning medications. Risks and benefits discussed with patient, patient expressed understanding and wished to proceed.)       Anesthesia Quick Evaluation

## 2017-07-16 NOTE — Anesthesia Procedure Notes (Signed)
Epidural Patient location during procedure: OB Start time: 07/16/2017 10:27 AM End time: 07/16/2017 10:31 AM  Staffing Anesthesiologist: Beryle LatheBrock, Saniyyah Elster E, MD Performed: anesthesiologist   Preanesthetic Checklist Completed: patient identified, pre-op evaluation, timeout performed, IV checked, risks and benefits discussed and monitors and equipment checked  Epidural Patient position: sitting Prep: DuraPrep Patient monitoring: continuous pulse ox and blood pressure Approach: midline Location: L2-L3 Injection technique: LOR saline  Needle:  Needle type: Tuohy  Needle gauge: 17 G Needle length: 9 cm Needle insertion depth: 8 cm Catheter size: 19 Gauge Catheter at skin depth: 13 cm Test dose: negative and Other (1% lidocaine)  Additional Notes Patient identified. Risks including, but not limited to, bleeding, infection, nerve damage, paralysis, inadequate analgesia, blood pressure changes, nausea, vomiting, allergic reaction, postpartum back pain, itching, and headache were discussed. Patient expressed understanding and wished to proceed. Sterile prep and drape, including hand hygiene, mask, and sterile gloves were used. The patient was positioned and the spine was prepped. The skin was anesthetized with lidocaine. No paraesthesia or other complication noted. The patient did not experience any signs of intravascular injection such as tinnitus or metallic taste in mouth, nor signs of intrathecal spread such as rapid motor block. Please see nursing notes for vital signs. The patient tolerated the procedure well.   Leslye Peerhomas Ayanni Tun, MDReason for block:procedure for pain

## 2017-07-16 NOTE — H&P (Signed)
27 y.o. G3P1011 @ 4524w0d presents with IOL for mild polyhydramnios .  Otherwise has good fetal movement and no bleeding.  Pregnancy c/b: 1. Mild polyhydramnios: AFI 27.  Passed 1hr gtt.  EFW at 38.6 weeks 4000g.  PP 8lb 1oz 2. Morbid obesity: BMI 41--TWG this pregnancy 7lb 3. History of shoulder dystocia w G1--resolved w delivery of posterior arm.  Per patient, no deficits in child.  Discussed recurrence rate of shoulder dystocia and option for primary cesarean delivery.  Patient declined cesarean section.   Past Medical History:  Diagnosis Date  . Anemia    this pregnancy - on iron  . History of gestational hypertension    gestational hypertension    Past Surgical History:  Procedure Laterality Date  . CHOLECYSTECTOMY      OB History  Gravida Para Term Preterm AB Living  3 1 1   1 1   SAB TAB Ectopic Multiple Live Births  1     0 1    # Outcome Date GA Lbr Len/2nd Weight Sex Delivery Anes PTL Lv  3 Current           2 Term 05/15/14 6234w4d 17:26 / 02:15 3.68 kg (8 lb 1.8 oz) F Vag-Spont EPI  LIV  1 SAB               Social History   Socioeconomic History  . Marital status: Married    Spouse name: Not on file  . Number of children: Not on file  . Years of education: Not on file  . Highest education level: Not on file  Tobacco Use  . Smoking status: Never Smoker  . Smokeless tobacco: Never Used  Substance and Sexual Activity  . Alcohol use: No  . Drug use: No  . Sexual activity: Yes    Birth control/protection: None  Other Topics Concern  . Not on file  Social History Narrative  . Not on file   Patient has no known allergies.    Prenatal Transfer Tool  Maternal Diabetes: No Genetic Screening: Declined Maternal Ultrasounds/Referrals: Normal--was referred to MFM for detailed scan d/t possible heart abnormality--MFM scan normal heart anatomy Fetal Ultrasounds or other Referrals:  Referred to Materal Fetal Medicine  Maternal Substance Abuse:  No Significant Maternal  Medications:  Meds include: Zantac Significant Maternal Lab Results: Lab values include: Group B Strep negative  ABO, Rh: A/Positive/-- (08/02 0000) Antibody: Negative (08/02 0000) Rubella: Immune (08/02 0000) RPR: Nonreactive (08/02 0000)  HBsAg: Negative (08/02 0000)  HIV: Non-reactive (08/02 0000)  GBS: Negative (02/14 0000)      Vitals:   07/16/17 0751 07/16/17 0844  BP: 115/68 111/65  Pulse: 92 92  Resp: 16 16  Temp: 98.3 F (36.8 C)      General:  NAD Abdomen:  soft, gravid, EFW 8.5-9 # Ex:  2+ edema SVE:  3/50/-2 FHTs:  130s, mod var, + accels, no decels Toco:  q5 min    A/P   27 y.o. G3P1011 1124w0d presents with IOL for mild polyhydramnios Admit to L&D Epidural upon patient request Will start pitocin now, AROM prn History of shoulder dystocia--have discussed risks of recurrence of approximately 10-15 % and possible complications (neonatal / maternal) arising from shoulder dystocia.  She is aware that IOL today does not decrease the risk of recurrence. Has declined primary cesarean delivery.  Increased risks of PPH given polyhydramnios--does not have contraindication to any uterotonics FSR/ vtx/ GBS neg  Lori Li Lori Li

## 2017-07-16 NOTE — Anesthesia Pain Management Evaluation Note (Signed)
  [facility] Pain Management Visit Note  Patient: Lori ChardKristin Li, 27 y.o., female  "Hello I am a [facility] of the anesthesia [facility]. We have an anesthesia team available at all times to provide care throughout the hospital, including epidural management and anesthesia for C-section. I don't know your plan for the delivery whether it a natural birth, water birth, IV sedation, nitrous supplementation, doula or epidural, but we want to meet your pain goals."   1.Was your pain managed to your expectations on prior hospitalizations?   Yes   2.What is your expectation for pain management during this hospitalization?     Epidural  3.How can we help you reach that goal?   Record the patient's initial score and the patient's pain goal.   Pain: 2  Pain Goal: 6 [facility] wants you to be able to say your pain was always managed very well.  Laban EmperorMalinova,Lori Li 07/16/2017

## 2017-07-16 NOTE — Progress Notes (Signed)
Comfortable w epidural  BP (!) 97/56   Pulse 87   Temp 98.3 F (36.8 C) (Oral)   Resp 16   Ht 5\' 4"  (1.626 m)   Wt 114.4 kg (252 lb 3.2 oz)   LMP 10/16/2016   SpO2 100%   BMI 43.29 kg/m   Toco: q2-3 min EFM: 120s, mod var, + accels, no decels SVE: 4/50/-2, AROM clear and copious fluid  A&P: g3P1011 @ 444w0d  Cont pitocin FSR/vtx/gbs neg

## 2017-07-17 ENCOUNTER — Encounter (HOSPITAL_COMMUNITY): Payer: Self-pay

## 2017-07-17 LAB — CBC
HEMATOCRIT: 30.7 % — AB (ref 36.0–46.0)
HEMOGLOBIN: 10.5 g/dL — AB (ref 12.0–15.0)
MCH: 28.1 pg (ref 26.0–34.0)
MCHC: 34.2 g/dL (ref 30.0–36.0)
MCV: 82.1 fL (ref 78.0–100.0)
Platelets: 197 10*3/uL (ref 150–400)
RBC: 3.74 MIL/uL — ABNORMAL LOW (ref 3.87–5.11)
RDW: 15.4 % (ref 11.5–15.5)
WBC: 13.4 10*3/uL — AB (ref 4.0–10.5)

## 2017-07-17 MED ORDER — DIBUCAINE 1 % RE OINT: 1.0000 "application " | TOPICAL_OINTMENT | RECTAL | Status: DC | PRN

## 2017-07-17 MED ORDER — WITCH HAZEL-GLYCERIN EX PADS: 1.0000 "application " | MEDICATED_PAD | CUTANEOUS | Status: DC | PRN

## 2017-07-17 MED ORDER — SENNOSIDES-DOCUSATE SODIUM 8.6-50 MG PO TABS
2.0000 | ORAL_TABLET | ORAL | Status: DC
  Administered 2017-07-17: 2 via ORAL
  Filled 2017-07-17: qty 2

## 2017-07-17 MED ORDER — DIPHENHYDRAMINE HCL 25 MG PO CAPS: 25.0000 mg | ORAL_CAPSULE | Freq: Four times a day (QID) | ORAL | Status: DC | PRN

## 2017-07-17 MED ORDER — PRENATAL MULTIVITAMIN CH
1.0000 | ORAL_TABLET | Freq: Every day | ORAL | Status: DC
  Filled 2017-07-17 (×2): qty 1

## 2017-07-17 MED ORDER — ONDANSETRON HCL 4 MG PO TABS: 4.0000 mg | ORAL_TABLET | ORAL | Status: DC | PRN

## 2017-07-17 MED ORDER — ONDANSETRON HCL 4 MG/2ML IJ SOLN: 4.0000 mg | INTRAMUSCULAR | Status: DC | PRN

## 2017-07-17 MED ORDER — SIMETHICONE 80 MG PO CHEW: 80.0000 mg | CHEWABLE_TABLET | ORAL | Status: DC | PRN

## 2017-07-17 MED ORDER — TETANUS-DIPHTH-ACELL PERTUSSIS 5-2.5-18.5 LF-MCG/0.5 IM SUSP: 0.5000 mL | Freq: Once | INTRAMUSCULAR | Status: DC

## 2017-07-17 MED ORDER — IBUPROFEN 600 MG PO TABS
600.0000 mg | ORAL_TABLET | Freq: Four times a day (QID) | ORAL | Status: DC
  Administered 2017-07-17 – 2017-07-18 (×6): 600 mg via ORAL
  Filled 2017-07-17 (×6): qty 1

## 2017-07-17 MED ORDER — OXYCODONE HCL 5 MG PO TABS
5.0000 mg | ORAL_TABLET | ORAL | Status: DC | PRN
  Administered 2017-07-17: 5 mg via ORAL
  Filled 2017-07-17: qty 1

## 2017-07-17 MED ORDER — OXYCODONE HCL 5 MG PO TABS: 10.0000 mg | ORAL_TABLET | ORAL | Status: DC | PRN

## 2017-07-17 MED ORDER — BENZOCAINE-MENTHOL 20-0.5 % EX AERO
1.0000 "application " | INHALATION_SPRAY | CUTANEOUS | Status: DC | PRN
  Administered 2017-07-17: 1 via TOPICAL
  Filled 2017-07-17: qty 56

## 2017-07-17 MED ORDER — COCONUT OIL OIL
1.0000 "application " | TOPICAL_OIL | Status: DC | PRN
  Filled 2017-07-17: qty 120

## 2017-07-17 MED ORDER — ACETAMINOPHEN 325 MG PO TABS: 650.0000 mg | ORAL_TABLET | ORAL | Status: DC | PRN

## 2017-07-17 NOTE — Lactation Note (Signed)
This note was copied from a baby's chart. Lactation Consultation Note Baby 22 hrs old. Starting to cluster feed. Mom has tubular breast flat nipples at the bottom end of the breast. Nipples compress well for latch. Bilateral nipples are bruised. Mom BF in cradle position. Discussed positioning, assisted in football position. Mom stated felt better. Taught mom how to control baby's head in football hold.  Mom demonstrated hand expression w/ easy low of colostrum. Newborn behavior, STS, I&O, cluster feeding, supply and demand discussed. Mom encouraged to feed baby 8-12 times/24 hours and with feeding cues.  Shells given, encouraged to wear, comfort gels given. Mom stated Lt. Nipple bleeding at times. Noted small scab in tip of nipple. Rt. Nipple bruised. Nipples tender. Encouraged to call for assistance or questions. [facility]/LC brochure given w/resources, support groups and LC services.  Patient Name: Lori Li's Date: 07/17/2017 Reason for consult: Initial assessment   Maternal Data Has patient been taught Hand Expression?: Yes Does the patient have breastfeeding experience prior to this delivery?: No  Feeding Feeding Type: Breast Fed Length of feed: 20 min  LATCH Score Latch: Repeated attempts needed to sustain latch, nipple held in mouth throughout feeding, stimulation needed to elicit sucking reflex.  Audible Swallowing: A few with stimulation  Type of Nipple: Flat  Comfort (Breast/Nipple): Filling, red/small blisters or bruises, mild/mod discomfort  Hold (Positioning): Assistance needed to correctly position infant at breast and maintain latch.  LATCH Score: 5  Interventions Interventions: Breast feeding basics reviewed;Assisted with latch;Breast compression;Shells;Skin to skin;Adjust position;Comfort gels;Breast massage;Support pillows;Hand pump;Hand express;Position options;Pre-pump if needed  Lactation Tools Discussed/Used Tools: Shells;Pump;Comfort gels Shell  Type: Inverted Breast pump type: Manual WIC Program: Yes Pump Review: Setup, frequency, and cleaning;Milk Storage Initiated by:: Peri JeffersonL. Camree Wigington RN IBCLC Date initiated:: 07/17/17   Consult Status Consult Status: Follow-up Date: 07/17/17 Follow-up type: In-patient    Charyl DancerCARVER, Nakeya Adinolfi G 07/17/2017, 11:41 PM

## 2017-07-17 NOTE — Progress Notes (Signed)
Patient is eating, ambulating, voiding.  Pain control is good.  Vitals:   07/17/17 0200 07/17/17 0215 07/17/17 0245 07/17/17 0350  BP: 112/64 (!) 123/57 (!) 122/55 (!) 118/52  Pulse: (!) 107 (!) 108 (!) 103 97  Resp: 18 16 18 18   Temp:   98.7 F (37.1 C) 98.2 F (36.8 C)  TempSrc:   Oral Oral  SpO2:      Weight:      Height:        Fundus firm Perineum without swelling.  Lab Results  Component Value Date   WBC 13.4 (H) 07/17/2017   HGB 10.5 (L) 07/17/2017   HCT 30.7 (L) 07/17/2017   MCV 82.1 07/17/2017   PLT 197 07/17/2017    --/--/A POS (03/07 0810)/RI  A/P Post partum day 1.  Routine care.  Expect d/c routine.    Lori Li A

## 2017-07-17 NOTE — Discharge Summary (Signed)
Obstetric Discharge Summary Reason for Admission: induction of labor Prenatal Procedures: none Intrapartum Procedures: spontaneous vaginal delivery Postpartum Procedures: none Complications-Operative and Postpartum: none Hemoglobin  Date Value Ref Range Status  07/17/2017 10.5 (L) 12.0 - 15.0 g/dL Final   HCT  Date Value Ref Range Status  07/17/2017 30.7 (L) 36.0 - 46.0 % Final     Discharge Diagnoses: Term Pregnancy-delivered  Discharge Information: Date: 07/17/2017 Activity: pelvic rest Diet: routine Medications: Ibuprofen Condition: stable Instructions: refer to practice specific booklet Discharge to: home Follow-up Information    [name], [name], MD Follow up in 4 week(s).   Specialty:  Obstetrics Contact information: [address] [place] [address] [phone]           Newborn Data: Live born female  Birth Weight: 8 lb 8 oz (3855 g) APGAR: 8, 9  Newborn Delivery   Birth date/time:  07/17/2017 01:04:00 Delivery type:  Vaginal, Spontaneous     Home with mother.  Lori Li A 07/17/2017, 7:11 AM

## 2017-07-17 NOTE — Anesthesia Postprocedure Evaluation (Signed)
Anesthesia Post Note  Patient: Lori ChardKristin Chern  Procedure(s) Performed: AN AD HOC LABOR EPIDURAL     Patient location during evaluation: Mother Baby Anesthesia Type: Epidural Level of consciousness: awake and alert Pain management: pain level controlled Vital Signs Assessment: post-procedure vital signs reviewed and stable Respiratory status: spontaneous breathing, nonlabored ventilation and respiratory function stable Cardiovascular status: stable Postop Assessment: no headache, no backache and epidural receding Anesthetic complications: no    Last Vitals:  Vitals:   07/17/17 0245 07/17/17 0350  BP: (!) 122/55 (!) 118/52  Pulse: (!) 103 97  Resp: 18 18  Temp: 37.1 C 36.8 C  SpO2:      Last Pain:  Vitals:   07/17/17 0627  TempSrc:   PainSc: 0-No pain   Pain Goal:                 Junious SilkGILBERT,Fawn Desrocher

## 2017-07-18 NOTE — Progress Notes (Signed)
Patient is eating, ambulating, voiding.  Pain control is good.  Vitals:   07/17/17 0819 07/17/17 1527 07/17/17 1855 07/18/17 0600  BP: (!) 109/52 121/70 112/60 110/75  Pulse: 74 80 87 63  Resp: 20 20 19 18   Temp: 98 F (36.7 C) 97.7 F (36.5 C) 97.6 F (36.4 C) (!) 97.4 F (36.3 C)  TempSrc: Oral Oral Oral Oral  SpO2:   99%   Weight:      Height:        Fundus firm Perineum without swelling.  Lab Results  Component Value Date   WBC 13.4 (H) 07/17/2017   HGB 10.5 (L) 07/17/2017   HCT 30.7 (L) 07/17/2017   MCV 82.1 07/17/2017   PLT 197 07/17/2017    --/--/A POS (03/07 0810)/RI  A/P Post partum day 1.  Routine care.  Expect d/c routine.    Seanne Chirico A

## 2019-11-04 LAB — OB RESULTS CONSOLE GC/CHLAMYDIA
Chlamydia: NEGATIVE
Gonorrhea: NEGATIVE

## 2019-11-07 LAB — HM PAP SMEAR: HM Pap smear: NEGATIVE

## 2019-12-01 LAB — OB RESULTS CONSOLE RUBELLA ANTIBODY, IGM: Rubella: NON-IMMUNE/NOT IMMUNE

## 2019-12-01 LAB — OB RESULTS CONSOLE HIV ANTIBODY (ROUTINE TESTING): HIV: NONREACTIVE

## 2019-12-01 LAB — OB RESULTS CONSOLE HEPATITIS B SURFACE ANTIGEN: Hepatitis B Surface Ag: NEGATIVE

## 2019-12-01 LAB — OB RESULTS CONSOLE RPR: RPR: NONREACTIVE

## 2020-04-13 ENCOUNTER — Other Ambulatory Visit: Payer: Self-pay | Admitting: Obstetrics & Gynecology

## 2020-04-13 DIAGNOSIS — Z363 Encounter for antenatal screening for malformations: Secondary | ICD-10-CM

## 2020-04-13 DIAGNOSIS — O99213 Obesity complicating pregnancy, third trimester: Secondary | ICD-10-CM

## 2020-04-13 DIAGNOSIS — Z3A32 32 weeks gestation of pregnancy: Secondary | ICD-10-CM

## 2020-04-23 ENCOUNTER — Encounter: Payer: Self-pay | Admitting: *Deleted

## 2020-04-25 ENCOUNTER — Ambulatory Visit: Payer: Medicaid Other | Attending: Obstetrics and Gynecology

## 2020-04-25 ENCOUNTER — Encounter: Payer: Self-pay | Admitting: *Deleted

## 2020-04-25 ENCOUNTER — Other Ambulatory Visit: Payer: Self-pay | Admitting: *Deleted

## 2020-04-25 ENCOUNTER — Ambulatory Visit: Payer: Medicaid Other | Admitting: *Deleted

## 2020-04-25 ENCOUNTER — Other Ambulatory Visit: Payer: Self-pay

## 2020-04-25 VITALS — BP 120/63 | HR 94

## 2020-04-25 DIAGNOSIS — D649 Anemia, unspecified: Secondary | ICD-10-CM

## 2020-04-25 DIAGNOSIS — E669 Obesity, unspecified: Secondary | ICD-10-CM | POA: Diagnosis not present

## 2020-04-25 DIAGNOSIS — Z6841 Body Mass Index (BMI) 40.0 and over, adult: Secondary | ICD-10-CM | POA: Diagnosis present

## 2020-04-25 DIAGNOSIS — O99213 Obesity complicating pregnancy, third trimester: Secondary | ICD-10-CM | POA: Diagnosis present

## 2020-04-25 DIAGNOSIS — Z363 Encounter for antenatal screening for malformations: Secondary | ICD-10-CM | POA: Diagnosis present

## 2020-04-25 DIAGNOSIS — Z3A32 32 weeks gestation of pregnancy: Secondary | ICD-10-CM

## 2020-04-25 DIAGNOSIS — O99013 Anemia complicating pregnancy, third trimester: Secondary | ICD-10-CM | POA: Diagnosis not present

## 2020-04-25 DIAGNOSIS — Z8616 Personal history of COVID-19: Secondary | ICD-10-CM

## 2020-04-25 DIAGNOSIS — O09293 Supervision of pregnancy with other poor reproductive or obstetric history, third trimester: Secondary | ICD-10-CM

## 2020-05-12 NOTE — L&D Delivery Note (Signed)
Delivery Note Patient pushed well for < 10 minutes.  At 10:02 PM a viable female was delivered via Vaginal, Spontaneous (Presentation: Left Occiput Anterior).  APGAR: 9, 9; weight 8 lb 7.8 oz (3850 g) .   Placenta status: Spontaneous, Intact.  Cord: 3 vessels with the following complications: None.  Cord pH: n/a  Anesthesia: Epidural Episiotomy: None Lacerations: None Suture Repair: n/a Est. Blood Loss (mL): 300  Mom to postpartum.  Baby to Couplet care / Skin to Skin.  Aubryn Spinola GEFFEL Avelynn Sellin 06/08/2020, 10:21 PM

## 2020-05-23 ENCOUNTER — Ambulatory Visit: Payer: Medicaid Other

## 2020-05-29 LAB — OB RESULTS CONSOLE GBS: GBS: NEGATIVE

## 2020-05-30 ENCOUNTER — Ambulatory Visit: Payer: Medicaid Other | Attending: Obstetrics and Gynecology

## 2020-05-30 ENCOUNTER — Encounter: Payer: Self-pay | Admitting: *Deleted

## 2020-05-30 ENCOUNTER — Other Ambulatory Visit: Payer: Self-pay

## 2020-05-30 ENCOUNTER — Ambulatory Visit: Payer: Medicaid Other | Admitting: *Deleted

## 2020-05-30 VITALS — BP 106/57 | HR 72

## 2020-05-30 DIAGNOSIS — O09293 Supervision of pregnancy with other poor reproductive or obstetric history, third trimester: Secondary | ICD-10-CM | POA: Diagnosis not present

## 2020-05-30 DIAGNOSIS — Z3A37 37 weeks gestation of pregnancy: Secondary | ICD-10-CM | POA: Diagnosis not present

## 2020-05-30 DIAGNOSIS — Z6841 Body Mass Index (BMI) 40.0 and over, adult: Secondary | ICD-10-CM

## 2020-05-30 DIAGNOSIS — R638 Other symptoms and signs concerning food and fluid intake: Secondary | ICD-10-CM

## 2020-05-30 DIAGNOSIS — O99213 Obesity complicating pregnancy, third trimester: Secondary | ICD-10-CM | POA: Diagnosis not present

## 2020-05-30 DIAGNOSIS — O99013 Anemia complicating pregnancy, third trimester: Secondary | ICD-10-CM

## 2020-05-30 DIAGNOSIS — D649 Anemia, unspecified: Secondary | ICD-10-CM

## 2020-05-30 DIAGNOSIS — E669 Obesity, unspecified: Secondary | ICD-10-CM | POA: Diagnosis not present

## 2020-06-06 ENCOUNTER — Ambulatory Visit: Payer: Medicaid Other | Admitting: *Deleted

## 2020-06-06 ENCOUNTER — Encounter: Payer: Self-pay | Admitting: *Deleted

## 2020-06-06 ENCOUNTER — Ambulatory Visit: Payer: Medicaid Other | Attending: Obstetrics and Gynecology

## 2020-06-06 ENCOUNTER — Other Ambulatory Visit: Payer: Self-pay

## 2020-06-06 VITALS — BP 109/60 | HR 79

## 2020-06-06 DIAGNOSIS — D649 Anemia, unspecified: Secondary | ICD-10-CM

## 2020-06-06 DIAGNOSIS — O09293 Supervision of pregnancy with other poor reproductive or obstetric history, third trimester: Secondary | ICD-10-CM | POA: Insufficient documentation

## 2020-06-06 DIAGNOSIS — O09299 Supervision of pregnancy with other poor reproductive or obstetric history, unspecified trimester: Secondary | ICD-10-CM

## 2020-06-06 DIAGNOSIS — Z6841 Body Mass Index (BMI) 40.0 and over, adult: Secondary | ICD-10-CM

## 2020-06-06 DIAGNOSIS — O99013 Anemia complicating pregnancy, third trimester: Secondary | ICD-10-CM

## 2020-06-06 DIAGNOSIS — O99213 Obesity complicating pregnancy, third trimester: Secondary | ICD-10-CM

## 2020-06-06 DIAGNOSIS — Z3A38 38 weeks gestation of pregnancy: Secondary | ICD-10-CM | POA: Diagnosis not present

## 2020-06-08 ENCOUNTER — Inpatient Hospital Stay (HOSPITAL_COMMUNITY): Payer: Medicaid Other | Admitting: Anesthesiology

## 2020-06-08 ENCOUNTER — Inpatient Hospital Stay (HOSPITAL_COMMUNITY): Payer: Medicaid Other

## 2020-06-08 ENCOUNTER — Encounter (HOSPITAL_COMMUNITY): Payer: Self-pay | Admitting: Obstetrics

## 2020-06-08 ENCOUNTER — Inpatient Hospital Stay (HOSPITAL_COMMUNITY)
Admission: AD | Admit: 2020-06-08 | Discharge: 2020-06-10 | DRG: 807 | Disposition: A | Discharge status: Home / Self Care | Payer: Medicaid Other | Attending: Obstetrics | Admitting: Obstetrics

## 2020-06-08 ENCOUNTER — Other Ambulatory Visit: Payer: Self-pay

## 2020-06-08 DIAGNOSIS — Z3A39 39 weeks gestation of pregnancy: Secondary | ICD-10-CM

## 2020-06-08 DIAGNOSIS — E669 Obesity, unspecified: Secondary | ICD-10-CM | POA: Diagnosis present

## 2020-06-08 DIAGNOSIS — Z8616 Personal history of COVID-19: Secondary | ICD-10-CM | POA: Diagnosis not present

## 2020-06-08 DIAGNOSIS — O99214 Obesity complicating childbirth: Secondary | ICD-10-CM | POA: Diagnosis present

## 2020-06-08 DIAGNOSIS — O99213 Obesity complicating pregnancy, third trimester: Secondary | ICD-10-CM | POA: Diagnosis present

## 2020-06-08 LAB — CBC
HCT: 32.8 % — ABNORMAL LOW (ref 36.0–46.0)
Hemoglobin: 10.6 g/dL — ABNORMAL LOW (ref 12.0–15.0)
MCH: 26.4 pg (ref 26.0–34.0)
MCHC: 32.3 g/dL (ref 30.0–36.0)
MCV: 81.8 fL (ref 80.0–100.0)
Platelets: 184 10*3/uL (ref 150–400)
RBC: 4.01 MIL/uL (ref 3.87–5.11)
RDW: 13.4 % (ref 11.5–15.5)
WBC: 8 10*3/uL (ref 4.0–10.5)
nRBC: 0 % (ref 0.0–0.2)

## 2020-06-08 LAB — TYPE AND SCREEN
ABO/RH(D): A POS
Antibody Screen: NEGATIVE

## 2020-06-08 LAB — RPR: RPR Ser Ql: NONREACTIVE

## 2020-06-08 MED ORDER — TETANUS-DIPHTH-ACELL PERTUSSIS 5-2.5-18.5 LF-MCG/0.5 IM SUSY: 0.5000 mL | PREFILLED_SYRINGE | Freq: Once | INTRAMUSCULAR | Status: DC

## 2020-06-08 MED ORDER — OXYCODONE HCL 5 MG PO TABS: 5.0000 mg | ORAL_TABLET | ORAL | Status: DC | PRN

## 2020-06-08 MED ORDER — OXYCODONE-ACETAMINOPHEN 5-325 MG PO TABS: 1.0000 | ORAL_TABLET | ORAL | Status: DC | PRN

## 2020-06-08 MED ORDER — OXYCODONE HCL 5 MG PO TABS: 10.0000 mg | ORAL_TABLET | ORAL | Status: DC | PRN

## 2020-06-08 MED ORDER — OXYCODONE-ACETAMINOPHEN 5-325 MG PO TABS: 2.0000 | ORAL_TABLET | ORAL | Status: DC | PRN

## 2020-06-08 MED ORDER — SIMETHICONE 80 MG PO CHEW: 80.0000 mg | CHEWABLE_TABLET | ORAL | Status: DC | PRN

## 2020-06-08 MED ORDER — LIDOCAINE HCL (PF) 1 % IJ SOLN: 30.0000 mL | INTRAMUSCULAR | Status: DC | PRN

## 2020-06-08 MED ORDER — BENZOCAINE-MENTHOL 20-0.5 % EX AERO: 1.0000 "application " | INHALATION_SPRAY | CUTANEOUS | Status: DC | PRN

## 2020-06-08 MED ORDER — TERBUTALINE SULFATE 1 MG/ML IJ SOLN: 0.2500 mg | Freq: Once | INTRAMUSCULAR | Status: DC | PRN

## 2020-06-08 MED ORDER — MISOPROSTOL 25 MCG QUARTER TABLET
25.0000 ug | ORAL_TABLET | Freq: Once | ORAL | Status: AC
  Administered 2020-06-08: 25 ug via VAGINAL
  Filled 2020-06-08: qty 1

## 2020-06-08 MED ORDER — ACETAMINOPHEN 325 MG PO TABS: 650.0000 mg | ORAL_TABLET | ORAL | Status: DC | PRN

## 2020-06-08 MED ORDER — EPHEDRINE 5 MG/ML INJ: 10.0000 mg | INTRAVENOUS | Status: DC | PRN

## 2020-06-08 MED ORDER — DIPHENHYDRAMINE HCL 50 MG/ML IJ SOLN: 12.5000 mg | INTRAMUSCULAR | Status: DC | PRN

## 2020-06-08 MED ORDER — PRENATAL MULTIVITAMIN CH
1.0000 | ORAL_TABLET | Freq: Every day | ORAL | Status: DC
  Administered 2020-06-09: 1 via ORAL
  Filled 2020-06-08: qty 1

## 2020-06-08 MED ORDER — OXYTOCIN-SODIUM CHLORIDE 30-0.9 UT/500ML-% IV SOLN
2.5000 [IU]/h | INTRAVENOUS | Status: DC
  Administered 2020-06-08: 2.5 [IU]/h via INTRAVENOUS
  Filled 2020-06-08: qty 500

## 2020-06-08 MED ORDER — PHENYLEPHRINE 40 MCG/ML (10ML) SYRINGE FOR IV PUSH (FOR BLOOD PRESSURE SUPPORT): 80.0000 ug | PREFILLED_SYRINGE | INTRAVENOUS | Status: DC | PRN

## 2020-06-08 MED ORDER — COCONUT OIL OIL: 1.0000 "application " | TOPICAL_OIL | Status: DC | PRN

## 2020-06-08 MED ORDER — FENTANYL CITRATE (PF) 100 MCG/2ML IJ SOLN: 50.0000 ug | INTRAMUSCULAR | Status: DC | PRN

## 2020-06-08 MED ORDER — FAMOTIDINE 20 MG PO TABS: 20.0000 mg | ORAL_TABLET | Freq: Two times a day (BID) | ORAL | Status: DC | PRN

## 2020-06-08 MED ORDER — OXYTOCIN BOLUS FROM INFUSION
333.0000 mL | Freq: Once | INTRAVENOUS | Status: AC
  Administered 2020-06-08: 333 mL via INTRAVENOUS

## 2020-06-08 MED ORDER — SOD CITRATE-CITRIC ACID 500-334 MG/5ML PO SOLN: 30.0000 mL | ORAL | Status: DC | PRN

## 2020-06-08 MED ORDER — ONDANSETRON HCL 4 MG/2ML IJ SOLN: 4.0000 mg | Freq: Four times a day (QID) | INTRAMUSCULAR | Status: DC | PRN

## 2020-06-08 MED ORDER — FENTANYL-BUPIVACAINE-NACL 0.5-0.125-0.9 MG/250ML-% EP SOLN
12.0000 mL/h | EPIDURAL | Status: DC | PRN
  Filled 2020-06-08: qty 250

## 2020-06-08 MED ORDER — SENNOSIDES-DOCUSATE SODIUM 8.6-50 MG PO TABS
2.0000 | ORAL_TABLET | ORAL | Status: DC
  Administered 2020-06-09 – 2020-06-10 (×2): 2 via ORAL
  Filled 2020-06-08 (×2): qty 2

## 2020-06-08 MED ORDER — LIDOCAINE HCL (PF) 1 % IJ SOLN
INTRAMUSCULAR | Status: DC | PRN
  Administered 2020-06-08: 2 mL via EPIDURAL
  Administered 2020-06-08: 10 mL via EPIDURAL

## 2020-06-08 MED ORDER — LACTATED RINGERS IV SOLN
500.0000 mL | Freq: Once | INTRAVENOUS | Status: AC
  Administered 2020-06-08: 500 mL via INTRAVENOUS

## 2020-06-08 MED ORDER — OXYTOCIN-SODIUM CHLORIDE 30-0.9 UT/500ML-% IV SOLN
1.0000 m[IU]/min | INTRAVENOUS | Status: DC
  Administered 2020-06-08: 2 m[IU]/min via INTRAVENOUS

## 2020-06-08 MED ORDER — FAMOTIDINE 20 MG PO TABS
20.0000 mg | ORAL_TABLET | Freq: Once | ORAL | Status: AC
  Administered 2020-06-08: 20 mg via ORAL
  Filled 2020-06-08: qty 1

## 2020-06-08 MED ORDER — FENTANYL-BUPIVACAINE-NACL 0.5-0.125-0.9 MG/250ML-% EP SOLN
EPIDURAL | Status: DC | PRN
  Administered 2020-06-08: 12 mL/h via EPIDURAL

## 2020-06-08 MED ORDER — WITCH HAZEL-GLYCERIN EX PADS: 1.0000 "application " | MEDICATED_PAD | CUTANEOUS | Status: DC | PRN

## 2020-06-08 MED ORDER — IBUPROFEN 600 MG PO TABS
600.0000 mg | ORAL_TABLET | Freq: Four times a day (QID) | ORAL | Status: DC
  Administered 2020-06-09 – 2020-06-10 (×6): 600 mg via ORAL
  Filled 2020-06-08 (×6): qty 1

## 2020-06-08 MED ORDER — LACTATED RINGERS IV SOLN
500.0000 mL | INTRAVENOUS | Status: DC | PRN
  Administered 2020-06-08: 1000 mL via INTRAVENOUS
  Administered 2020-06-08: 500 mL via INTRAVENOUS

## 2020-06-08 MED ORDER — ONDANSETRON HCL 4 MG/2ML IJ SOLN: 4.0000 mg | INTRAMUSCULAR | Status: DC | PRN

## 2020-06-08 MED ORDER — LACTATED RINGERS IV SOLN: INTRAVENOUS | Status: DC

## 2020-06-08 MED ORDER — DIBUCAINE (PERIANAL) 1 % EX OINT: 1.0000 "application " | TOPICAL_OINTMENT | CUTANEOUS | Status: DC | PRN

## 2020-06-08 MED ORDER — ONDANSETRON HCL 4 MG PO TABS: 4.0000 mg | ORAL_TABLET | ORAL | Status: DC | PRN

## 2020-06-08 MED ORDER — DIPHENHYDRAMINE HCL 25 MG PO CAPS: 25.0000 mg | ORAL_CAPSULE | Freq: Four times a day (QID) | ORAL | Status: DC | PRN

## 2020-06-08 NOTE — Anesthesia Preprocedure Evaluation (Signed)
Anesthesia Evaluation  Patient identified by MRN, date of birth, ID band Patient awake    Reviewed: Allergy & Precautions, Patient's Chart, lab work & pertinent test results  Airway Mallampati: III  TM Distance: >3 FB Neck ROM: Full    Dental no notable dental hx.    Pulmonary neg pulmonary ROS,    Pulmonary exam normal breath sounds clear to auscultation       Cardiovascular hypertension (gest HTN), Normal cardiovascular exam Rhythm:Regular Rate:Normal     Neuro/Psych negative neurological ROS  negative psych ROS   GI/Hepatic Neg liver ROS, GERD  Medicated and Controlled,  Endo/Other  Morbid obesityBMI 43  Renal/GU negative Renal ROS  negative genitourinary   Musculoskeletal negative musculoskeletal ROS (+)   Abdominal (+) + obese,   Peds negative pediatric ROS (+)  Hematology  (+) Blood dyscrasia, anemia , hct 32.8, plt 184   Anesthesia Other Findings   Reproductive/Obstetrics (+) Pregnancy                             Anesthesia Physical Anesthesia Plan  ASA: III and emergent  Anesthesia Plan: Epidural   Post-op Pain Management:    Induction:   PONV Risk Score and Plan: 2  Airway Management Planned: Natural Airway  Additional Equipment: None  Intra-op Plan:   Post-operative Plan:   Informed Consent: I have reviewed the patients History and Physical, chart, labs and discussed the procedure including the risks, benefits and alternatives for the proposed anesthesia with the patient or authorized representative who has indicated his/her understanding and acceptance.       Plan Discussed with:   Anesthesia Plan Comments:         Anesthesia Quick Evaluation

## 2020-06-08 NOTE — H&P (Signed)
30 y.o. [id number] @ [redacted]w[redacted]d presents for IOL for maternal obesity.  Otherwise has good fetal movement and no bleeding.  Pregnancy complicated by:  1. History of gestational hypertension:  On low dose aspirin this pregnancy 2.  Class 3 obesity:  Pre-pregnancy BMI 43.  Growth [place] with MFM 1/19: EFW 7lb 10 oz (76%).   3. Covid infection: In August around 14 weeks 4. History of shoulder dystocia w G1--resolved w delivery of posterior arm.  Per patient, no deficits in child (8lb 1oz).  G2 SVD without complications 8lb 8oz  Past Medical History:  Diagnosis Date  . Anemia    this pregnancy - on iron  . History of gestational hypertension    gestational hypertension    Past Surgical History:  Procedure Laterality Date  . CHOLECYSTECTOMY      OB History  Gravida Para Term Preterm AB Living  4 2 2   1 2   SAB IAB Ectopic Multiple Live Births  1     0 2    # Outcome Date GA Lbr Len/2nd Weight Sex Delivery Anes PTL Lv  4 Current           3 Term 07/17/17 [redacted]w[redacted]d 12:07 / 00:44 3855 g F Vag-Spont EPI  LIV  2 Term 05/15/14 [redacted]w[redacted]d 17:26 / 02:15 3680 g F Vag-Spont EPI  LIV  1 SAB             Social History   Socioeconomic History  . Marital status: Married    Spouse name: Not on file  . Number of children: Not on file  . Years of education: Not on file  . Highest education level: Not on file  Occupational History  . Not on file  Tobacco Use  . Smoking status: Never Smoker  . Smokeless tobacco: Never Used  Vaping Use  . Vaping Use: Never used  Substance and Sexual Activity  . Alcohol use: No  . Drug use: No  . Sexual activity: Yes    Birth control/protection: None  Other Topics Concern  . Not on file  Social History Narrative  . Not on file       Patient has no known allergies.    Prenatal Transfer Tool  Maternal Diabetes: No Genetic Screening: Declined Maternal Ultrasounds/Referrals: Normal Fetal Ultrasounds or other Referrals:  None Maternal Substance Abuse:  No Significant  Maternal Medications:  None Significant Maternal Lab Results: Group B Strep negative  ABO, Rh: --/--/A POS (01/28 0559) Antibody: NEG (01/28 0559) Rubella: Nonimmune (07/22 0000) RPR: Nonreactive (07/22 0000)  HBsAg: Negative (07/22 0000)  HIV: Non-reactive (07/22 0000)  GBS: Negative/-- (01/18 0000)      Vitals:   06/08/20 06/10/20 06/08/20 0718  BP: 115/66 121/62  Pulse: 89 72  Resp: 16 18  Temp: 98.2 F (36.8 C) 97.9 F (36.6 C)     General:  NAD Abdomen:  soft, gravid, EFW 8.5# Ex:  1+ edema bilaterally SVE:  2.5/50/-3 FHTs:  130d, moderate variability, + accelerations Toco:  irregular   A/P   30 y.o. 38 [redacted]w[redacted]d presents for induction of labor for maternal Class 3 obesity per MFM Admit to L&D IOL--received one dose of cytotec upon admission.  Will start pitocin / AROM when able History of shoulder dystocia--have discussed risks of recurrence of approximately 10-15 % and possible complications (neonatal / maternal) arising from shoulder dystocia.  She is aware that IOL today does not decrease the risk of recurrence. Has declined primary cesarean delivery.  FSR/ vtx/  GBS negative  Lori Li Lori Li

## 2020-06-08 NOTE — Anesthesia Procedure Notes (Addendum)
Epidural Patient location during procedure: OB Start time: 06/08/2020 11:02 AM End time: 06/08/2020 11:22 AM  Staffing Anesthesiologist: Lannie Fields, DO Performed: anesthesiologist   Preanesthetic Checklist Completed: patient identified, IV checked, risks and benefits discussed, monitors and equipment checked, pre-op evaluation and timeout performed  Epidural Patient position: sitting Prep: DuraPrep and site prepped and draped Patient monitoring: continuous pulse ox, blood pressure, heart rate and cardiac monitor Approach: midline Location: L3-L4 Injection technique: LOR air  Needle:  Needle type: Tuohy  Needle gauge: 17 G Needle length: 9 cm Needle insertion depth: 7.5 cm Catheter type: closed end flexible Catheter size: 19 Gauge Catheter at skin depth: 13 cm Test dose: negative  Assessment Sensory level: T8 Events: blood not aspirated, injection not painful, no injection resistance, no paresthesia and negative IV test  Additional Notes DPE with 24G sprotte, difficult epidural placement, successful at second levelReason for block:procedure for pain

## 2020-06-08 NOTE — Progress Notes (Signed)
Comfortable with epidural  BP 92/74   Pulse 82   Temp 97.9 F (36.6 C) (Oral)   Resp 18   Ht 5\' 4"  (1.626 m)   Wt 113.6 kg   LMP 09/09/2019   BMI 43.00 kg/m   Toco: q2 minutes EFM: 150s, moderate variability,  + scalp stimulation with exam but  SVE:  4/50/-3, AROM clear fluid,   A/P: G4P2 @ 39w for IOL Continue pitocin Anticipate SVD

## 2020-06-09 LAB — CBC
HCT: 28.3 % — ABNORMAL LOW (ref 36.0–46.0)
Hemoglobin: 9.2 g/dL — ABNORMAL LOW (ref 12.0–15.0)
MCH: 26.5 pg (ref 26.0–34.0)
MCHC: 32.5 g/dL (ref 30.0–36.0)
MCV: 81.6 fL (ref 80.0–100.0)
Platelets: 170 10*3/uL (ref 150–400)
RBC: 3.47 MIL/uL — ABNORMAL LOW (ref 3.87–5.11)
RDW: 13.5 % (ref 11.5–15.5)
WBC: 12.5 10*3/uL — ABNORMAL HIGH (ref 4.0–10.5)
nRBC: 0 % (ref 0.0–0.2)

## 2020-06-09 NOTE — Anesthesia Postprocedure Evaluation (Signed)
Anesthesia Post Note  Patient: Lori Li  Procedure(s) Performed: AN AD HOC LABOR EPIDURAL     Patient location during evaluation: Mother Baby Anesthesia Type: Epidural Level of consciousness: awake and alert Pain management: pain level controlled Vital Signs Assessment: post-procedure vital signs reviewed and stable Respiratory status: spontaneous breathing, nonlabored ventilation and respiratory function stable Cardiovascular status: stable Postop Assessment: no headache, no backache and epidural receding Anesthetic complications: no   No complications documented.  Last Vitals:  Vitals:   06/09/20 0505 06/09/20 0900  BP: 117/69 108/79  Pulse: 87 77  Resp: 16 18  Temp: 36.8 C 36.8 C  SpO2: 100% 99%    Last Pain:  Vitals:   06/09/20 0900  TempSrc: Oral  PainSc: 2    Pain Goal: Patients Stated Pain Goal: 6 (06/08/20 4854)              Epidural/Spinal Function Cutaneous sensation: Normal sensation (06/09/20 0900), Patient able to flex knees: Yes (06/09/20 0900), Patient able to lift hips off bed: Yes (06/09/20 0900), Back pain beyond tenderness at insertion site: No (06/09/20 0900), Progressively worsening motor and/or sensory loss: No (06/09/20 0900), Bowel and/or bladder incontinence post epidural: No (06/09/20 0900)  [facility] Records

## 2020-06-09 NOTE — Progress Notes (Signed)
Patient is doing well.  She is ambulating, voiding, tolerating PO.  Pain control is good.  Lochia is appropriate  Vitals:   06/08/20 2245 06/08/20 2300 06/08/20 2345 06/09/20 0050  BP: 121/75 117/74 111/64 129/76  Pulse: (!) 102 81 87 98  Resp:   16 16  Temp:   98.6 F (37 C) 98 F (36.7 C)  TempSrc:   Oral Oral  SpO2:   100% 100%  Weight:      Height:        NAD Fundus firm Ext: 1+ edema bilaterally  Lab Results  Component Value Date   WBC 8.0 06/08/2020   HGB 10.6 (L) 06/08/2020   HCT 32.8 (L) 06/08/2020   MCV 81.8 06/08/2020   PLT 184 06/08/2020    --/--/A POS (01/28 0559)/RImmune  A/P 30 y.o. [id number] PPD#1 s/p TSVD. Routine care.   Rubella immune--erronesouly charted as non-immune in Epic.  Does not require MMR PP Expect d/c tomorrow.    Ipava

## 2020-06-10 NOTE — Discharge Summary (Signed)
Postpartum Discharge Summary  Date of Service updated 06/10/20     Patient Name: Lori Li DOB: [birth year] MRN: [id number]  Date of admission: 06/08/2020 Delivery date:06/08/2020  Delivering provider: Marlow Baars  Date of discharge: 06/10/2020  Admitting diagnosis: Maternal obesity, antepartum, third trimester [O99.213] Intrauterine pregnancy: [redacted]w[redacted]d     Secondary diagnosis:  Active Problems:   Maternal obesity, antepartum, third trimester  Additional problems: None    Discharge diagnosis: Term Pregnancy Delivered                                              Post partum procedures:None Augmentation: AROM, Pitocin and Cytotec Complications: None  Hospital course: Induction of Labor With Vaginal Delivery   30 y.o. yo [id number] at [redacted]w[redacted]d was admitted to the hospital 06/08/2020 for induction of labor.  Indication for induction: Maternal obesity.  Patient had an uncomplicated labor course as follows: Membrane Rupture Time/Date: 12:01 PM ,06/08/2020   Delivery Method:Vaginal, Spontaneous  Episiotomy: None  Lacerations:  None  Details of delivery can be found in separate delivery note.  Patient had a routine postpartum course. Patient is discharged home 06/10/20.  Newborn Data: Birth date:06/08/2020  Birth time:10:02 PM  Gender:Female  Living status:Living  Apgars:9 ,9  Weight:3850 g    Physical exam  Vitals:   06/09/20 0900 06/09/20 1300 06/09/20 2107 06/10/20 0557  BP: 108/79 117/80 121/70 120/67  Pulse: 77 89 68 68  Resp: 18 16 18 18   Temp: 98.2 F (36.8 C) 97.8 F (36.6 C) (!) 97.4 F (36.3 C) 97.7 F (36.5 C)  TempSrc: Oral Oral Oral Oral  SpO2: 99% 99% 100% 100%  Weight:      Height:       General: alert, cooperative and no distress Lochia: appropriate Uterine Fundus: firm DVT Evaluation: No evidence of DVT seen on physical exam. Labs: Lab Results  Component Value Date   WBC 12.5 (H) 06/09/2020   HGB 9.2 (L) 06/09/2020   HCT 28.3 (L) 06/09/2020   MCV  81.6 06/09/2020   PLT 170 06/09/2020   CMP Latest Ref Rng & Units 07/10/2017  Glucose 65 - 99 mg/dL 80  BUN 6 - 20 mg/dL 8  Creatinine 09/09/2017 - 6.26 mg/dL 9.48)  Sodium 5.46(E - 703 mmol/L 134(L)  Potassium 3.5 - 5.1 mmol/L 3.9  Chloride 101 - 111 mmol/L 108  CO2 22 - 32 mmol/L 17(L)  Calcium 8.9 - 10.3 mg/dL 9.0  Total Protein 6.5 - 8.1 g/dL 6.7  Total Bilirubin 0.3 - 1.2 mg/dL 0.7  Alkaline Phos 38 - 126 U/L 108  AST 15 - 41 U/L 17  ALT 14 - 54 U/L 11(L)   Edinburgh Score: Edinburgh Postnatal Depression Scale Screening Tool 06/09/2020  I have been able to laugh and see the funny side of things. 0  I have looked forward with enjoyment to things. 0  I have blamed myself unnecessarily when things went wrong. 0  I have been anxious or worried for no good reason. 0  I have felt scared or panicky for no good reason. 0  Things have been getting on top of me. 0  I have been so unhappy that I have had difficulty sleeping. 0  I have felt sad or miserable. 0  I have been so unhappy that I have been crying. 0  The thought of harming myself has  occurred to me. 0  Edinburgh Postnatal Depression Scale Total 0      After visit meds:  Allergies as of 06/10/2020   No Known Allergies     Medication List    STOP taking these medications   ranitidine 75 MG tablet Commonly known as: ZANTAC     TAKE these medications   ferrous sulfate 325 (65 FE) MG tablet Take 325 mg by mouth at bedtime.   prenatal multivitamin Tabs tablet Take 1 tablet by mouth at bedtime.        Discharge home in stable condition Infant Feeding: Breast Infant Disposition:home with mother Discharge instruction: per After Visit Summary and Postpartum booklet. Activity: Advance as tolerated. Pelvic rest for 6 weeks.  Diet: routine diet Postpartum Appointment:4 weeks  Future Appointments:No future appointments. Follow up Visit:  Follow-up Information    Marlow Baars, MD Follow up in 4 week(s).   Specialty:  Obstetrics Contact information: [address] [place] [address] [phone]                   06/10/2020 [facility] Lizabeth Leyden, MD

## 2022-01-31 DIAGNOSIS — R202 Paresthesia of skin: Secondary | ICD-10-CM | POA: Diagnosis not present

## 2022-01-31 DIAGNOSIS — Z131 Encounter for screening for diabetes mellitus: Secondary | ICD-10-CM | POA: Diagnosis not present

## 2022-01-31 DIAGNOSIS — R5382 Chronic fatigue, unspecified: Secondary | ICD-10-CM | POA: Diagnosis not present

## 2022-01-31 DIAGNOSIS — E039 Hypothyroidism, unspecified: Secondary | ICD-10-CM | POA: Diagnosis not present

## 2022-01-31 DIAGNOSIS — R232 Flushing: Secondary | ICD-10-CM | POA: Diagnosis not present

## 2022-01-31 DIAGNOSIS — Z130 Encounter for screening for diseases of the blood and blood-forming organs and certain disorders involving the immune mechanism: Secondary | ICD-10-CM | POA: Diagnosis not present

## 2022-03-21 ENCOUNTER — Ambulatory Visit: Payer: Medicaid Other | Admitting: Family

## 2022-04-11 ENCOUNTER — Encounter: Payer: Self-pay | Admitting: Family

## 2022-04-11 ENCOUNTER — Ambulatory Visit: Payer: BC Managed Care – PPO | Admitting: Family

## 2022-04-11 VITALS — BP 124/82 | HR 61 | Temp 98.0°F | Resp 18 | Ht 64.0 in | Wt 251.6 lb

## 2022-04-11 DIAGNOSIS — E669 Obesity, unspecified: Secondary | ICD-10-CM

## 2022-04-11 DIAGNOSIS — R7309 Other abnormal glucose: Secondary | ICD-10-CM

## 2022-04-11 DIAGNOSIS — E039 Hypothyroidism, unspecified: Secondary | ICD-10-CM | POA: Diagnosis not present

## 2022-04-11 LAB — CBC WITH DIFFERENTIAL/PLATELET
Basophils Absolute: 0 10*3/uL (ref 0.0–0.1)
Basophils Relative: 0.6 % (ref 0.0–3.0)
Eosinophils Absolute: 0.1 10*3/uL (ref 0.0–0.7)
Eosinophils Relative: 1 % (ref 0.0–5.0)
HCT: 38.3 % (ref 36.0–46.0)
Hemoglobin: 12.9 g/dL (ref 12.0–15.0)
Lymphocytes Relative: 41.2 % (ref 12.0–46.0)
Lymphs Abs: 2.4 10*3/uL (ref 0.7–4.0)
MCHC: 33.7 g/dL (ref 30.0–36.0)
MCV: 85.4 fl (ref 78.0–100.0)
Monocytes Absolute: 0.4 10*3/uL (ref 0.1–1.0)
Monocytes Relative: 6.3 % (ref 3.0–12.0)
Neutro Abs: 3 10*3/uL (ref 1.4–7.7)
Neutrophils Relative %: 50.9 % (ref 43.0–77.0)
Platelets: 201 10*3/uL (ref 150.0–400.0)
RBC: 4.49 Mil/uL (ref 3.87–5.11)
RDW: 13.3 % (ref 11.5–15.5)
WBC: 5.8 10*3/uL (ref 4.0–10.5)

## 2022-04-11 LAB — COMPREHENSIVE METABOLIC PANEL
ALT: 20 U/L (ref 0–35)
AST: 18 U/L (ref 0–37)
Albumin: 4.7 g/dL (ref 3.5–5.2)
Alkaline Phosphatase: 56 U/L (ref 39–117)
BUN: 16 mg/dL (ref 6–23)
CO2: 27 mEq/L (ref 19–32)
Calcium: 9.2 mg/dL (ref 8.4–10.5)
Chloride: 104 mEq/L (ref 96–112)
Creatinine, Ser: 0.52 mg/dL (ref 0.40–1.20)
GFR: 122.54 mL/min (ref >60.00)
Glucose, Bld: 83 mg/dL (ref 70–99)
Potassium: 4.2 mEq/L (ref 3.5–5.1)
Sodium: 138 mEq/L (ref 135–145)
Total Bilirubin: 0.8 mg/dL (ref 0.2–1.2)
Total Protein: 7.1 g/dL (ref 6.0–8.3)

## 2022-04-11 LAB — HEMOGLOBIN A1C: Hgb A1c MFr Bld: 5.6 % (ref 4.6–6.5)

## 2022-04-11 NOTE — Progress Notes (Signed)
  Lori Li is a 31 y.o. female with the following history as recorded in EpicCare:  Patient Active Problem List   Diagnosis Date Noted   Maternal obesity, antepartum, third trimester 06/08/2020   Polyhydramnios affecting pregnancy in third trimester 07/16/2017   Hypertension complicating pregnancy 32/00/3794   Threatened abortion in first trimester 06/25/2013    Current Outpatient Medications  Medication Sig Dispense Refill   metroNIDAZOLE (METROGEL) 1 % gel Apply topically daily.     levothyroxine (SYNTHROID) 125 MCG tablet Take 125 mcg by mouth every morning.     No current facility-administered medications for this visit.    Allergies: Patient has no known allergies.  Past Medical History:  Diagnosis Date   Anemia    this pregnancy - on iron   History of gestational hypertension    gestational hypertension    Past Surgical History:  Procedure Laterality Date   CHOLECYSTECTOMY      Family History  Problem Relation Age of Onset   Heart disease Mother    Diabetes Father     Social History   Tobacco Use   Smoking status: Never   Smokeless tobacco: Never  Substance Use Topics   Alcohol use: No    Subjective:   Patient presents today as a new patient; is concerned about weight issues;  History of hypothyroidism; would be interested be in trial of Mounjaro if possible;    Dr[facility] Carlis Abbott at [facility]; planning to schedule follow up soon;  2016 (daughter), 2019 (daughter), 2022 (son);  LMP 11/19;     Objective:  Vitals:   04/11/22 1010  BP: 124/82  Pulse: 61  Resp: 18  Temp: 98 F (36.7 C)  SpO2: 97%  Weight: 251 lb 9.6 oz (114.1 kg)  Height: _0  (1.626 m)    General: Well developed, well nourished, in no acute distress  Skin : Warm and dry.  Head: Normocephalic and atraumatic  Lungs: Respirations unlabored; clear to auscultation bilaterally without wheeze, rales, rhonchi  CVS exam: normal rate and regular rhythm.  Neurologic: Alert and  oriented; speech intact; face symmetrical; moves all extremities well; CNII-XII intact without focal deficit   Assessment:  1. Elevated glucose   2. Hypothyroidism, unspecified type   3. Obesity, unspecified classification, unspecified obesity type, unspecified whether serious comorbidity present     Plan:  Update labs today- to consider trial of Mounjaro; may also want to consider referral to [facility]; follow up to be determined.   No follow-ups on file.  Orders Placed This Encounter  Procedures   CBC with Differential/Platelet   Comp Met (CMET)   Hemoglobin A1c    Requested Prescriptions    No prescriptions requested or ordered in this encounter

## 2022-04-15 ENCOUNTER — Telehealth: Payer: Self-pay

## 2022-04-15 ENCOUNTER — Encounter: Payer: Self-pay | Admitting: Family

## 2022-04-15 ENCOUNTER — Other Ambulatory Visit: Payer: Self-pay | Admitting: Family

## 2022-04-15 MED ORDER — TIRZEPATIDE 2.5 MG/0.5ML ~~LOC~~ SOAJ: 2.5000 mg | SUBCUTANEOUS | 0 refills | Status: DC

## 2022-04-15 NOTE — Telephone Encounter (Signed)
PA initiated via Covermymeds; KEY: [id number]. Awaiting determination.

## 2022-04-16 NOTE — Telephone Encounter (Signed)
Denial reason- Pt is not type 2 diabetic.

## 2022-04-16 NOTE — Telephone Encounter (Signed)
Awaiting on denial letter for reason.  Denied today Your request has been denied

## 2022-04-17 ENCOUNTER — Other Ambulatory Visit: Payer: Self-pay | Admitting: Family

## 2022-04-17 DIAGNOSIS — Z6841 Body Mass Index (BMI) 40.0 and over, adult: Secondary | ICD-10-CM

## 2022-04-17 NOTE — Telephone Encounter (Signed)
See my chart message

## 2022-04-21 NOTE — Telephone Encounter (Signed)
Pt called stating that she had talked with her insurance and they had stated that did not have a record of our office submitting a PA for [facility]. Advised pt that a note would be sent back to investigate this further as we had received a denial regard this medication from them. Pt acknowledged understanding.

## 2022-04-22 ENCOUNTER — Other Ambulatory Visit: Payer: Self-pay | Admitting: Family

## 2022-04-22 MED ORDER — ZEPBOUND 2.5 MG/0.5ML ~~LOC~~ SOAJ: 2.5000 mg | SUBCUTANEOUS | 0 refills | Status: DC

## 2022-04-23 NOTE — Telephone Encounter (Signed)
Spoke with Pt she stated she is willing to try Zepbound and would like to see if her insurance will cover it. If not she may be interested in looking for coupons so she's able to pay out of pocket.

## 2022-04-24 ENCOUNTER — Telehealth: Payer: Self-pay

## 2022-04-24 NOTE — Telephone Encounter (Signed)
PA initiated via Covermymeds; KEY: [id number]. Awaiting determination.

## 2022-04-24 NOTE — Telephone Encounter (Signed)
PA denied.   Per Plan: WEIGHT LOSS DRUGS NOT COVERED

## 2022-04-25 NOTE — Telephone Encounter (Signed)
Patient notified

## 2022-05-26 ENCOUNTER — Encounter: Payer: Self-pay | Admitting: Family

## 2022-06-03 ENCOUNTER — Other Ambulatory Visit: Payer: Self-pay | Admitting: Family

## 2022-06-03 MED ORDER — ZEPBOUND 5 MG/0.5ML ~~LOC~~ SOAJ
5.0000 mg | SUBCUTANEOUS | 0 refills | Status: DC
Start: 2022-06-03 — End: 2022-07-04

## 2022-07-02 ENCOUNTER — Other Ambulatory Visit: Payer: Self-pay | Admitting: Family

## 2022-07-04 ENCOUNTER — Other Ambulatory Visit: Payer: Self-pay | Admitting: Family

## 2022-07-04 ENCOUNTER — Telehealth: Payer: Self-pay | Admitting: Family

## 2022-07-04 MED ORDER — ZEPBOUND 5 MG/0.5ML ~~LOC~~ SOAJ: 5.0000 mg | SUBCUTANEOUS | 1 refills | Status: DC

## 2022-07-04 NOTE — Telephone Encounter (Signed)
lw 

## 2022-07-14 NOTE — Telephone Encounter (Signed)
Spoke with pt, pt states she would like to continue the Zepbound. Pt sates she feels nauseous only the day after her dose she usually does them Monday evenings. Pt also states she has lost 19lbs since starting but due to her discount card she has not able to pick it up as of last week. Pt would like to stay on current dosage and considering increasing next month.

## 2022-08-14 ENCOUNTER — Other Ambulatory Visit: Payer: Self-pay | Admitting: Family

## 2022-08-14 ENCOUNTER — Encounter: Payer: Self-pay | Admitting: Family

## 2022-08-14 MED ORDER — ZEPBOUND 7.5 MG/0.5ML ~~LOC~~ SOAJ: 7.5000 mg | SUBCUTANEOUS | 0 refills | Status: DC

## 2022-11-27 ENCOUNTER — Encounter: Payer: Self-pay | Admitting: Family

## 2022-11-27 ENCOUNTER — Ambulatory Visit: Payer: BC Managed Care – PPO | Admitting: Family

## 2022-11-27 VITALS — BP 114/72 | HR 64 | Ht 64.0 in | Wt 251.8 lb

## 2022-11-27 DIAGNOSIS — N912 Amenorrhea, unspecified: Secondary | ICD-10-CM

## 2022-11-27 DIAGNOSIS — E039 Hypothyroidism, unspecified: Secondary | ICD-10-CM

## 2022-11-27 DIAGNOSIS — Z6841 Body Mass Index (BMI) 40.0 and over, adult: Secondary | ICD-10-CM

## 2022-11-27 MED ORDER — LEVOTHYROXINE SODIUM 125 MCG PO TABS
125.0000 ug | ORAL_TABLET | Freq: Every morning | ORAL | 0 refills | Status: DC
Start: 2022-11-27 — End: 2023-02-25

## 2022-11-27 NOTE — Patient Instructions (Signed)
[facility]-

## 2022-11-27 NOTE — Progress Notes (Signed)
  Lori Li is a 32 y.o. female with the following history as recorded in EpicCare:  Patient Active Problem List   Diagnosis Date Noted   Maternal obesity, antepartum, third trimester 06/08/2020   Polyhydramnios affecting pregnancy in third trimester 07/16/2017   Hypertension complicating pregnancy 05/14/2014   Threatened abortion in first trimester 06/25/2013    Current Outpatient Medications  Medication Sig Dispense Refill   levothyroxine (SYNTHROID) 125 MCG tablet Take 1 tablet (125 mcg total) by mouth every morning. 90 tablet 0   No current facility-administered medications for this visit.    Allergies: Patient has no known allergies.  Past Medical History:  Diagnosis Date   Anemia    this pregnancy - on iron   History of gestational hypertension    gestational hypertension    Past Surgical History:  Procedure Laterality Date   CHOLECYSTECTOMY      Family History  Problem Relation Age of Onset   Heart disease Mother    Diabetes Father     Social History   Tobacco Use   Smoking status: Never   Smokeless tobacco: Never  Substance Use Topics   Alcohol use: No    Subjective:   Patient presents requesting to get her thyroid level re-checked; admit she has not been taking her medication regularly but just feeling very tired recently; also would like to get pregnancy test- period is late/ home pregnancy test on Monday was negative;    Objective:  Vitals:   11/27/22 1428  BP: 114/72  Pulse: 64  SpO2: 100%  Weight: 251 lb 12.8 oz (114.2 kg)  Height: 5\' 4"  (1.626 m)    General: Well developed, well nourished, in no acute distress  Skin : Warm and dry.  Head: Normocephalic and atraumatic  Lungs: Respirations unlabored;  Neurologic: Alert and oriented; speech intact; face symmetrical; moves all extremities well; CNII-XII intact without focal deficit   Assessment:  1. Hypothyroidism, unspecified type   2. Amenorrhea   3. BMI 40.0-44.9, adult [facility])     Plan:   Not taking her medication regularly but will update TSH to establish baseline; if level is not controlled, will plan to re-check after being back on medication x 4-6 weeks; ? Related to not taking her thyroid medication regularly; will check pregnancy test today; Referral to healthy weight and wellness; she will also consider looking at [facility];   No follow-ups on file.  Orders Placed This Encounter  Procedures   Thyroid Panel With TSH   hCG, quantitative, pregnancy   CBC with Differential/Platelet   Comp Met (CMET)   Amb Ref to [facility]    Referral Priority:   Routine    Referral Type:   Consultation    Number of Visits Requested:   1    Requested Prescriptions   Signed Prescriptions Disp Refills   levothyroxine (SYNTHROID) 125 MCG tablet 90 tablet 0    Sig: Take 1 tablet (125 mcg total) by mouth every morning.

## 2022-11-28 LAB — CBC WITH DIFFERENTIAL/PLATELET
Basophils Absolute: 0.1 10*3/uL (ref 0.0–0.1)
Basophils Relative: 1.3 % (ref 0.0–3.0)
Eosinophils Absolute: 0.1 10*3/uL (ref 0.0–0.7)
Eosinophils Relative: 1.5 % (ref 0.0–5.0)
HCT: 37.4 % (ref 36.0–46.0)
Hemoglobin: 12.4 g/dL (ref 12.0–15.0)
Lymphocytes Relative: 40.3 % (ref 12.0–46.0)
Lymphs Abs: 2.6 10*3/uL (ref 0.7–4.0)
MCHC: 33.2 g/dL (ref 30.0–36.0)
MCV: 85.8 fl (ref 78.0–100.0)
Monocytes Absolute: 0.5 10*3/uL (ref 0.1–1.0)
Monocytes Relative: 7.1 % (ref 3.0–12.0)
Neutro Abs: 3.1 10*3/uL (ref 1.4–7.7)
Neutrophils Relative %: 49.8 % (ref 43.0–77.0)
Platelets: 227 10*3/uL (ref 150.0–400.0)
RBC: 4.36 Mil/uL (ref 3.87–5.11)
RDW: 13.6 % (ref 11.5–15.5)
WBC: 6.3 10*3/uL (ref 4.0–10.5)

## 2022-11-28 LAB — COMPREHENSIVE METABOLIC PANEL
ALT: 22 U/L (ref 0–35)
AST: 18 U/L (ref 0–37)
Albumin: 4.5 g/dL (ref 3.5–5.2)
Alkaline Phosphatase: 64 U/L (ref 39–117)
BUN: 13 mg/dL (ref 6–23)
CO2: 25 mEq/L (ref 19–32)
Calcium: 9.3 mg/dL (ref 8.4–10.5)
Chloride: 103 mEq/L (ref 96–112)
Creatinine, Ser: 0.58 mg/dL (ref 0.40–1.20)
GFR: 118.83 mL/min (ref >60.00)
Glucose, Bld: 80 mg/dL (ref 70–99)
Potassium: 3.9 mEq/L (ref 3.5–5.1)
Sodium: 137 mEq/L (ref 135–145)
Total Bilirubin: 0.6 mg/dL (ref 0.2–1.2)
Total Protein: 7 g/dL (ref 6.0–8.3)

## 2022-11-28 LAB — HCG, QUANTITATIVE, PREGNANCY: Quantitative HCG: <0.6 m[IU]/mL

## 2022-11-28 LAB — THYROID PANEL WITH TSH
Free Thyroxine Index: 1.4 (ref 1.4–3.8)
T3 Uptake: 25 % (ref 22–35)
T4, Total: 5.5 ug/dL (ref 5.1–11.9)
TSH: 16.05 mIU/L — ABNORMAL HIGH

## 2022-11-30 ENCOUNTER — Encounter: Payer: Self-pay | Admitting: Emergency Medicine

## 2022-11-30 ENCOUNTER — Ambulatory Visit
Admission: EM | Admit: 2022-11-30 | Discharge: 2022-11-30 | Disposition: A | Discharge status: Home / Self Care | Payer: BC Managed Care – PPO | Attending: Family Medicine | Admitting: Family Medicine

## 2022-11-30 DIAGNOSIS — K529 Noninfective gastroenteritis and colitis, unspecified: Secondary | ICD-10-CM

## 2022-11-30 DIAGNOSIS — R6889 Other general symptoms and signs: Secondary | ICD-10-CM

## 2022-11-30 DIAGNOSIS — E039 Hypothyroidism, unspecified: Secondary | ICD-10-CM | POA: Insufficient documentation

## 2022-11-30 LAB — POCT INFLUENZA A/B
Influenza A, POC: NEGATIVE
Influenza B, POC: NEGATIVE

## 2022-11-30 LAB — POC SARS CORONAVIRUS 2 AG -  ED: SARS Coronavirus 2 Ag: NEGATIVE

## 2022-11-30 MED ORDER — ONDANSETRON HCL 8 MG PO TABS: 8.0000 mg | ORAL_TABLET | Freq: Three times a day (TID) | ORAL | 0 refills | Status: AC | PRN

## 2022-11-30 NOTE — ED Provider Notes (Signed)
[facility]    CSN: [id number] Arrival date & time: 11/30/22  1355      History   Chief Complaint Chief Complaint  Patient presents with   Fever   Chills   Emesis   Diarrhea   Generalized Body Aches    HPI Lori Li is a 32 y.o. female.   HPI  Patient has nausea, vomiting, diarrhea, fever, chills, body aches, fatigue.  This has been going on for a couple of days.  She states that she has no respiratory symptoms or cough.  She has no exposure to illness.  She states she feels like she has "the flu" Past Medical History:  Diagnosis Date   Anemia    this pregnancy - on iron   History of gestational hypertension    gestational hypertension    Patient Active Problem List   Diagnosis Date Noted   Hypothyroidism 11/30/2022   Obesity, morbid (HCC) 11/30/2022   Hypertension complicating pregnancy 05/14/2014    Past Surgical History:  Procedure Laterality Date   CHOLECYSTECTOMY      OB History     Gravida  4   Para  2   Term  2   Preterm      AB  1   Living  2      SAB  1   IAB      Ectopic      Multiple  0   Live Births  2            Home Medications    Prior to Admission medications   Medication Sig Start Date End Date Taking? Authorizing Provider  ondansetron (ZOFRAN) 8 MG tablet Take 1 tablet (8 mg total) by mouth every 8 (eight) hours as needed for nausea or vomiting. 11/30/22  Yes Eustace Moore, MD  levothyroxine (SYNTHROID) 125 MCG tablet Take 1 tablet (125 mcg total) by mouth every morning. 11/27/22   Olive Bass, FNP    Family History Family History  Problem Relation Age of Onset   Heart disease Mother    Diabetes Father     Social History Social History   Tobacco Use   Smoking status: Never   Smokeless tobacco: Never  Vaping Use   Vaping status: Never Used  Substance Use Topics   Alcohol use: No   Drug use: No     Allergies   Patient has no known allergies.   Review of  Systems Review of Systems See HPI  Physical Exam Triage Vital Signs ED Triage Vitals  Encounter Vitals Group     BP 11/30/22 1408 133/87     Systolic BP Percentile --      Diastolic BP Percentile --      Pulse Rate 11/30/22 1408 77     Resp 11/30/22 1408 18     Temp 11/30/22 1408 98.3 F (36.8 C)     Temp Source 11/30/22 1408 Oral     SpO2 11/30/22 1408 99 %     Weight --      Height --      Head Circumference --      Peak Flow --      Pain Score 11/30/22 1409 3     Pain Loc --      Pain Education --      Exclude from Growth Chart --    No data found.  Updated Vital Signs BP 133/87 (BP Location: Left Arm)   Pulse 77   Temp  98.3 F (36.8 C) (Oral)   Resp 18   LMP 10/27/2022 (Within Days) Comment: negative pregancncy test 11/24/2022  SpO2 99%      Physical Exam Constitutional:      General: She is not in acute distress.    Appearance: She is well-developed. She is obese. She is ill-appearing.  HENT:     Head: Normocephalic and atraumatic.     Mouth/Throat:     Mouth: Mucous membranes are moist.  Eyes:     Conjunctiva/sclera: Conjunctivae normal.     Pupils: Pupils are equal, round, and reactive to light.  Cardiovascular:     Rate and Rhythm: Normal rate and regular rhythm.     Heart sounds: Normal heart sounds.  Pulmonary:     Effort: Pulmonary effort is normal. No respiratory distress.     Breath sounds: Normal breath sounds.  Abdominal:     General: Bowel sounds are normal. There is no distension.     Palpations: Abdomen is soft.     Tenderness: There is no abdominal tenderness.  Musculoskeletal:        General: Normal range of motion.     Cervical back: Normal range of motion.  Skin:    General: Skin is warm and dry.  Neurological:     Mental Status: She is alert.      UC Treatments / Results  Labs (all labs ordered are listed, but only abnormal results are displayed) Labs Reviewed  POC SARS CORONAVIRUS 2 AG -  ED  POCT INFLUENZA A/B     EKG   Radiology No results found.  Procedures Procedures (including critical care time)  Medications Ordered in UC Medications - No data to display  Initial Impression / Assessment and Plan / UC Course  I have reviewed the triage vital signs and the nursing notes.  Pertinent labs & imaging results that were available during my care of the patient were reviewed by me and considered in my medical decision making (see chart for details).     Flu and COVID tests are negative.  Patient has a flulike virus.  Will treat symptomatically Final Clinical Impressions(s) / UC Diagnoses   Final diagnoses:  Gastroenteritis  Flu-like symptoms     Discharge Instructions      Take Zofran as needed for nausea Try to increase your fluid intake May use Imodium if diarrhea is severe.  Should slow down in a day or 2 Bland diet Tylenol or ibuprofen for pain and fever Call for problems   ED Prescriptions     Medication Sig Dispense Auth. Provider   ondansetron (ZOFRAN) 8 MG tablet Take 1 tablet (8 mg total) by mouth every 8 (eight) hours as needed for nausea or vomiting. 20 tablet Eustace Moore, MD      PDMP not reviewed this encounter.   Eustace Moore, MD 11/30/22 [phone]

## 2022-11-30 NOTE — Discharge Instructions (Signed)
Take Zofran as needed for nausea Try to increase your fluid intake May use Imodium if diarrhea is severe.  Should slow down in a day or 2 Bland diet Tylenol or ibuprofen for pain and fever Call for problems

## 2022-11-30 NOTE — ED Triage Notes (Signed)
Pt reports a fever (max temp 103), vomiting, diarrhea, body aches, headache and abdominal cramping. Symptoms began Friday afternoon. Has been taking Ibuprofen for pain.

## 2022-12-11 ENCOUNTER — Encounter (INDEPENDENT_AMBULATORY_CARE_PROVIDER_SITE_OTHER): Payer: Self-pay

## 2023-02-25 ENCOUNTER — Other Ambulatory Visit: Payer: Self-pay | Admitting: Family

## 2023-06-05 ENCOUNTER — Telehealth: Payer: Self-pay | Admitting: Family

## 2023-06-05 ENCOUNTER — Other Ambulatory Visit: Payer: Self-pay | Admitting: Family

## 2023-06-05 NOTE — Telephone Encounter (Signed)
Please see if she is still a patient here; she was due to get her thyroid level checked in September/ October 2024; needs to get labs updated.

## 2023-06-05 NOTE — Telephone Encounter (Signed)
Called pt and left a VM to inform pt she is due for a follow up appointment before we are able to refill medications.

## 2023-06-08 NOTE — Telephone Encounter (Signed)
Pt called back and scheduled a follow up appointment 06/09/2023.  Copied from CRM [id number]. Topic: Appointments - Appointment Scheduling >> Jun 05, 2023  4:56 PM Tiffany H wrote: Patient/patient representative is calling to schedule an appointment. Refer to attachments for appointment information.   Scheduled 06/09/23 visit at 9AM.

## 2023-06-09 ENCOUNTER — Encounter: Payer: Self-pay | Admitting: Family

## 2023-06-09 ENCOUNTER — Other Ambulatory Visit: Payer: Self-pay | Admitting: Family

## 2023-06-09 ENCOUNTER — Ambulatory Visit: Payer: BC Managed Care – PPO | Admitting: Family

## 2023-06-09 VITALS — BP 114/74 | HR 73 | Ht 64.0 in | Wt 244.4 lb

## 2023-06-09 DIAGNOSIS — J069 Acute upper respiratory infection, unspecified: Secondary | ICD-10-CM | POA: Diagnosis not present

## 2023-06-09 DIAGNOSIS — E039 Hypothyroidism, unspecified: Secondary | ICD-10-CM

## 2023-06-09 LAB — COMPREHENSIVE METABOLIC PANEL
ALT: 26 U/L (ref 0–35)
AST: 21 U/L (ref 0–37)
Albumin: 4.6 g/dL (ref 3.5–5.2)
Alkaline Phosphatase: 53 U/L (ref 39–117)
BUN: 11 mg/dL (ref 6–23)
CO2: 27 meq/L (ref 19–32)
Calcium: 9.6 mg/dL (ref 8.4–10.5)
Chloride: 104 meq/L (ref 96–112)
Creatinine, Ser: 0.52 mg/dL (ref 0.40–1.20)
GFR: 121.55 mL/min (ref >60.00)
Glucose, Bld: 87 mg/dL (ref 70–99)
Potassium: 4.1 meq/L (ref 3.5–5.1)
Sodium: 138 meq/L (ref 135–145)
Total Bilirubin: 0.5 mg/dL (ref 0.2–1.2)
Total Protein: 6.8 g/dL (ref 6.0–8.3)

## 2023-06-09 LAB — CBC WITH DIFFERENTIAL/PLATELET
Basophils Absolute: 0 10*3/uL (ref 0.0–0.1)
Basophils Relative: 0.7 % (ref 0.0–3.0)
Eosinophils Absolute: 0.1 10*3/uL (ref 0.0–0.7)
Eosinophils Relative: 2.5 % (ref 0.0–5.0)
HCT: 39.7 % (ref 36.0–46.0)
Hemoglobin: 13.1 g/dL (ref 12.0–15.0)
Lymphocytes Relative: 39.6 % (ref 12.0–46.0)
Lymphs Abs: 1.8 10*3/uL (ref 0.7–4.0)
MCHC: 33.1 g/dL (ref 30.0–36.0)
MCV: 86 fL (ref 78.0–100.0)
Monocytes Absolute: 0.4 10*3/uL (ref 0.1–1.0)
Monocytes Relative: 8.8 % (ref 3.0–12.0)
Neutro Abs: 2.2 10*3/uL (ref 1.4–7.7)
Neutrophils Relative %: 48.4 % (ref 43.0–77.0)
Platelets: 180 10*3/uL (ref 150.0–400.0)
RBC: 4.62 Mil/uL (ref 3.87–5.11)
RDW: 14.3 % (ref 11.5–15.5)
WBC: 4.6 10*3/uL (ref 4.0–10.5)

## 2023-06-09 LAB — TSH: TSH: 0.13 u[IU]/mL — ABNORMAL LOW (ref 0.35–5.50)

## 2023-06-09 MED ORDER — LEVOTHYROXINE SODIUM 112 MCG PO TABS: 112.0000 ug | ORAL_TABLET | Freq: Every day | ORAL | 0 refills | Status: DC

## 2023-06-09 NOTE — Progress Notes (Signed)
  Lori Li is a 33 y.o. female with the following history as recorded in EpicCare:  Patient Active Problem List   Diagnosis Date Noted   Hypothyroidism 11/30/2022   Obesity, morbid (HCC) 11/30/2022   Hypertension complicating pregnancy 05/14/2014    Current Outpatient Medications  Medication Sig Dispense Refill   levothyroxine (SYNTHROID) 125 MCG tablet Take 1 tablet (125 mcg total) by mouth daily before breakfast. 90 tablet 0   ondansetron (ZOFRAN) 8 MG tablet Take 1 tablet (8 mg total) by mouth every 8 (eight) hours as needed for nausea or vomiting. 20 tablet 0   No current facility-administered medications for this visit.    Allergies: Patient has no known allergies.  Past Medical History:  Diagnosis Date   Anemia    this pregnancy - on iron   History of gestational hypertension    gestational hypertension    Past Surgical History:  Procedure Laterality Date   CHOLECYSTECTOMY      Family History  Problem Relation Age of Onset   Heart disease Mother    Diabetes Father     Social History   Tobacco Use   Smoking status: Never   Smokeless tobacco: Never  Substance Use Topics   Alcohol use: No    Subjective:   Follow up on hypothyroidism; was due for repeat labs last fall but unfortunately was not able to follow up before now; no concerns that dosage is not correct;   Overdue to see her GYN- is planning to schedule; notes that husband is considering to get a vasectomy;  2-3 day history of cough/ congestion; no fever; did take Sudafed and is feeling better today;    Objective:  Vitals:   06/09/23 0904  BP: 114/74  Pulse: 73  SpO2: 100%  Weight: 244 lb 6.4 oz (110.9 kg)  Height: 5\' 4"  (1.626 m)    General: Well developed, well nourished, in no acute distress  Skin : Warm and dry.  Head: Normocephalic and atraumatic  Eyes: Sclera and conjunctiva clear; pupils round and reactive to light; extraocular movements intact  Ears: External normal; canals clear;  tympanic membranes normal  Oropharynx: Pink, supple. No suspicious lesions  Neck: Supple without thyromegaly, adenopathy  Lungs: Respirations unlabored; clear to auscultation bilaterally without wheeze, rales, rhonchi  CVS exam: normal rate and regular rhythm.  Neurologic: Alert and oriented; speech intact; face symmetrical; moves all extremities well; CNII-XII intact without focal deficit   Assessment:  1. Hypothyroidism, unspecified type   2. Viral URI     Plan:  Check labs today; will update refill as needed based on lab results; Physical exam is reassuring; continue symptomatic treatment- use Sudafed as needed; increase fluids, rest and follow up worse, no better;   Encouraged patient to schedule a follow up with her GYN;   No follow-ups on file.  Orders Placed This Encounter  Procedures   CBC with Differential/Platelet   Comp Met (CMET)   TSH    Requested Prescriptions    No prescriptions requested or ordered in this encounter

## 2023-07-24 ENCOUNTER — Other Ambulatory Visit (INDEPENDENT_AMBULATORY_CARE_PROVIDER_SITE_OTHER): Payer: BC Managed Care – PPO

## 2023-07-24 DIAGNOSIS — E039 Hypothyroidism, unspecified: Secondary | ICD-10-CM | POA: Diagnosis not present

## 2023-07-24 LAB — TSH: TSH: 1.7 u[IU]/mL (ref 0.35–5.50)

## 2023-07-27 ENCOUNTER — Other Ambulatory Visit: Payer: Self-pay | Admitting: Family

## 2023-07-27 ENCOUNTER — Encounter: Payer: Self-pay | Admitting: Family

## 2023-07-27 MED ORDER — LEVOTHYROXINE SODIUM 112 MCG PO TABS: 112.0000 ug | ORAL_TABLET | Freq: Every day | ORAL | 3 refills | Status: AC

## 2023-09-21 ENCOUNTER — Encounter: Payer: Self-pay | Admitting: Family

## 2023-10-27 ENCOUNTER — Ambulatory Visit
Admission: RE | Admit: 2023-10-27 | Discharge: 2023-10-27 | Disposition: A | Discharge status: Home / Self Care | Source: Ambulatory Visit | Attending: Family | Admitting: Family

## 2023-10-27 ENCOUNTER — Ambulatory Visit: Admitting: Family

## 2023-10-27 ENCOUNTER — Other Ambulatory Visit: Payer: Self-pay | Admitting: Family

## 2023-10-27 ENCOUNTER — Encounter: Payer: Self-pay | Admitting: Family

## 2023-10-27 ENCOUNTER — Telehealth: Payer: Self-pay

## 2023-10-27 ENCOUNTER — Ambulatory Visit: Payer: Self-pay | Admitting: Family

## 2023-10-27 VITALS — BP 132/84 | HR 72 | Ht 64.0 in | Wt 252.0 lb

## 2023-10-27 DIAGNOSIS — K760 Fatty (change of) liver, not elsewhere classified: Secondary | ICD-10-CM | POA: Diagnosis not present

## 2023-10-27 DIAGNOSIS — R1032 Left lower quadrant pain: Secondary | ICD-10-CM | POA: Diagnosis not present

## 2023-10-27 DIAGNOSIS — K6389 Other specified diseases of intestine: Secondary | ICD-10-CM | POA: Diagnosis not present

## 2023-10-27 LAB — CBC WITH DIFFERENTIAL/PLATELET
Basophils Absolute: 0 10*3/uL (ref 0.0–0.1)
Basophils Relative: 0.2 % (ref 0.0–3.0)
Eosinophils Absolute: 0.1 10*3/uL (ref 0.0–0.7)
Eosinophils Relative: 1.9 % (ref 0.0–5.0)
HCT: 37.8 % (ref 36.0–46.0)
Hemoglobin: 12.9 g/dL (ref 12.0–15.0)
Lymphocytes Relative: 32.9 % (ref 12.0–46.0)
Lymphs Abs: 2.1 10*3/uL (ref 0.7–4.0)
MCHC: 34 g/dL (ref 30.0–36.0)
MCV: 85.1 fl (ref 78.0–100.0)
Monocytes Absolute: 0.3 10*3/uL (ref 0.1–1.0)
Monocytes Relative: 5 % (ref 3.0–12.0)
Neutro Abs: 3.8 10*3/uL (ref 1.4–7.7)
Neutrophils Relative %: 60 % (ref 43.0–77.0)
Platelets: 198 10*3/uL (ref 150.0–400.0)
RBC: 4.45 Mil/uL (ref 3.87–5.11)
RDW: 13.5 % (ref 11.5–15.5)
WBC: 6.4 10*3/uL (ref 4.0–10.5)

## 2023-10-27 LAB — COMPREHENSIVE METABOLIC PANEL WITH GFR
ALT: 45 U/L — ABNORMAL HIGH (ref 0–35)
AST: 26 U/L (ref 0–37)
Albumin: 4.2 g/dL (ref 3.5–5.2)
Alkaline Phosphatase: 61 U/L (ref 39–117)
BUN: 14 mg/dL (ref 6–23)
CO2: 25 meq/L (ref 19–32)
Calcium: 9.1 mg/dL (ref 8.4–10.5)
Chloride: 105 meq/L (ref 96–112)
Creatinine, Ser: 0.5 mg/dL (ref 0.40–1.20)
GFR: 122.37 mL/min
Glucose, Bld: 93 mg/dL (ref 70–99)
Potassium: 3.9 meq/L (ref 3.5–5.1)
Sodium: 136 meq/L (ref 135–145)
Total Bilirubin: 0.5 mg/dL (ref 0.2–1.2)
Total Protein: 7.2 g/dL (ref 6.0–8.3)

## 2023-10-27 LAB — POCT URINE PREGNANCY: Preg Test, Ur: NEGATIVE

## 2023-10-27 MED ORDER — IBUPROFEN 800 MG PO TABS: 800.0000 mg | ORAL_TABLET | Freq: Three times a day (TID) | ORAL | 0 refills | Status: AC | PRN

## 2023-10-27 MED ORDER — IOPAMIDOL (ISOVUE-300) INJECTION 61%
100.0000 mL | Freq: Once | INTRAVENOUS | Status: AC | PRN
  Administered 2023-10-27: 100 mL via INTRAVENOUS

## 2023-10-27 NOTE — Telephone Encounter (Signed)
 Initial Comment Caller states she is having some pain in her side. Translation No Nurse Assessment Nurse: Titus Formosa, RN, Aleen Ammons Date/Time (Eastern Time): 10/27/2023 8:34:44 AM Confirm and document reason for call. If symptomatic, describe symptoms. ---Caller states she has been having pain in the left lower abdomen since saturday and is constant but intensity goes up and down. Pain right now is 3/10. no painful or bloody urination Does the patient have any new or worsening symptoms? ---Yes Will a triage be completed? ---Yes Related visit to physician within the last 2 weeks? ---No Does the PT have any chronic conditions? (i.e. diabetes, asthma, this includes High risk factors for pregnancy, etc.) ---No Is the patient pregnant or possibly pregnant? (Ask all females between the ages of 50-55) ---No Is this a behavioral health or substance abuse call? ---No Guidelines Guideline Title Affirmed Question Affirmed Notes Nurse Date/Time (Eastern Time) Abdominal Pain - Female [1] MILDMODERATE pain AND [2] constant AND [3] present > 2 hours Titus Formosa, RNAleen Ammons 10/27/2023 8:36:55 AM PLEASE NOTE: All timestamps contained within this report are represented as [place] Standard Time. CONFIDENTIALTY NOTICE: This fax transmission is intended only for the addressee. It contains information that is legally privileged, confidential or otherwise protected from use or disclosure. If you are not the intended recipient, you are strictly prohibited from reviewing, disclosing, copying using or disseminating any of this information or taking any action in reliance on or regarding this information. If you have received this fax in error, please notify us  immediately by telephone so that we can arrange for its return to us . Phone: [phone], Toll-Free: [phone], Fax: [phone] KRISTIN_LAROE 1990-09-30 Page: 2 of 2 CallId: [id number] Disp. Time [facility] Time) Disposition Final User 10/27/2023 8:38:47 AM  See HCP within 4 Hours (or PCP triage) Yes Titus Formosa, RN, Aleen Ammons Final Disposition 10/27/2023 8:38:47 AM See HCP within 4 Hours (or PCP triage) Yes Titus Formosa, RN, Judene Noss Disagree/Comply Comply Caller Understands Yes PreDisposition Call Doctor Care Advice Given Per Guideline SEE HCP (OR PCP TRIAGE) WITHIN 4 HOURS: * IF OFFICE WILL BE OPEN: You need to be seen within the next 3 or 4 hours. Call your doctor (or NP/PA) now or as soon as the office opens. * You become worse CALL BACK IF: CARE ADVICE given per Abdominal Pain - Female (Adult) guideline. Comments User: Winnie Haver, RN Date/Time [facility] Time): 10/27/2023 8:38:15 AM appointment scheduled at 0940 Referrals REFERRED TO PCP OFFICE

## 2023-10-27 NOTE — Progress Notes (Signed)
 Lori Li is a 33 y.o. female with the following history as recorded in EpicCare:  Patient Active Problem List   Diagnosis Date Noted   Hypothyroidism 11/30/2022   Obesity, morbid (HCC) 11/30/2022   Hypertension complicating pregnancy 05/14/2014    Current Outpatient Medications  Medication Sig Dispense Refill   levothyroxine  (SYNTHROID ) 112 MCG tablet Take 1 tablet (112 mcg total) by mouth daily before breakfast. 90 tablet 3   ondansetron  (ZOFRAN ) 8 MG tablet Take 1 tablet (8 mg total) by mouth every 8 (eight) hours as needed for nausea or vomiting. (Patient not taking: Reported on 10/27/2023) 20 tablet 0   No current facility-administered medications for this visit.    Allergies: Patient has no known allergies.  Past Medical History:  Diagnosis Date   Anemia    this pregnancy - on iron   History of gestational hypertension    gestational hypertension    Past Surgical History:  Procedure Laterality Date   CHOLECYSTECTOMY      Family History  Problem Relation Age of Onset   Heart disease Mother    Diabetes Father     Social History   Tobacco Use   Smoking status: Never   Smokeless tobacco: Never  Substance Use Topics   Alcohol use: No    Subjective:   LLQ pain started suddenly on Saturday afternoon- notes that symptoms are worse by the end of the day; no fever, no nausea, no vomiting;  LMP 5/19; not using any type of birth control but feels pregnancy unlikely;  notes that 2 weeks after last period had irregular bleeding for 1 day; no prior history of ovarian cysts/ fibroids;  Bowel movements normal;     Objective:  Vitals:   10/27/23 0938  BP: 132/84  Pulse: 72  SpO2: 98%  Weight: 252 lb (114.3 kg)  Height: 5' 4 (1.626 m)    General: Well developed, well nourished, in no acute distress  Skin : Warm and dry.  Head: Normocephalic and atraumatic  Lungs: Respirations unlabored; clear to auscultation bilaterally without wheeze, rales, rhonchi  CVS exam:  normal rate and regular rhythm.  Abdomen: Soft; LLQ tender to palpation; nondistended; normoactive bowel sounds; no masses or hepatosplenomegaly  Musculoskeletal: No deformities; no active joint inflammation  Extremities: No edema, cyanosis, clubbing  Vessels: Symmetric bilaterally  Neurologic: Alert and oriented; speech intact; face symmetrical; moves all extremities well; CNII-XII intact without focal deficit   Assessment:  1. LLQ abdominal pain   2. Left lower quadrant abdominal pain     Plan:  ? Diverticulitis vs pelvic source; urine hcg is negative in office; update CBC, CMP today; STAT abd/ pelvic CT; follow up will be determined;   No follow-ups on file.  Orders Placed This Encounter  Procedures   CT ABDOMEN PELVIS W CONTRAST    [facility] [id number] Wt /No Needs /nt diabetic/nkda to iv dye or contrast/NO HBP/no kid dz or liver dz /No spinal cord/No body injector/no glucose mon/no heart monitor/NO PORT ab w/laila@[facility]  will have pt p/u contrast Please remember if you need to cancel your appt, please do so 24 hours prior to your appointment to avoid getting charged a no-show fee of $75.00 pt is aware/pt verbal understood instructions given    Standing Status:   Future    Number of Occurrences:   1    Expiration Date:   10/26/2024    If indicated for the ordered procedure, I authorize the administration of contrast media per Radiology protocol:   Yes  Does the patient have a contrast media/X-ray dye allergy?:   No    Is patient pregnant?:   No    Preferred imaging location?:   [facility]    If indicated for the ordered procedure, I authorize the administration of oral contrast media per Radiology protocol:   Yes   CBC with Differential/Platelet   Comp Met (CMET)   POCT urine pregnancy    Requested Prescriptions    No prescriptions requested or ordered in this encounter
